# Patient Record
Sex: Female | Born: 1945 | Race: White | Hispanic: No | Smoking: Former smoker
Health system: Southern US, Community
[De-identification: ages and names within clinical notes are randomized; demographics above are authoritative.]

## PROBLEM LIST (undated history)

## (undated) DIAGNOSIS — Z87442 Personal history of urinary calculi: Secondary | ICD-10-CM

## (undated) DIAGNOSIS — G5793 Unspecified mononeuropathy of bilateral lower limbs: Secondary | ICD-10-CM

## (undated) DIAGNOSIS — I341 Nonrheumatic mitral (valve) prolapse: Secondary | ICD-10-CM

## (undated) DIAGNOSIS — M5430 Sciatica, unspecified side: Secondary | ICD-10-CM

## (undated) DIAGNOSIS — I10 Essential (primary) hypertension: Secondary | ICD-10-CM

## (undated) DIAGNOSIS — Z9889 Other specified postprocedural states: Secondary | ICD-10-CM

## (undated) DIAGNOSIS — F32A Depression, unspecified: Secondary | ICD-10-CM

## (undated) DIAGNOSIS — E785 Hyperlipidemia, unspecified: Secondary | ICD-10-CM

## (undated) DIAGNOSIS — Z974 Presence of external hearing-aid: Secondary | ICD-10-CM

## (undated) DIAGNOSIS — G2581 Restless legs syndrome: Secondary | ICD-10-CM

## (undated) DIAGNOSIS — J45909 Unspecified asthma, uncomplicated: Secondary | ICD-10-CM

## (undated) DIAGNOSIS — I509 Heart failure, unspecified: Secondary | ICD-10-CM

## (undated) DIAGNOSIS — E119 Type 2 diabetes mellitus without complications: Secondary | ICD-10-CM

## (undated) DIAGNOSIS — F329 Major depressive disorder, single episode, unspecified: Secondary | ICD-10-CM

## (undated) DIAGNOSIS — M539 Dorsopathy, unspecified: Secondary | ICD-10-CM

## (undated) DIAGNOSIS — E039 Hypothyroidism, unspecified: Secondary | ICD-10-CM

## (undated) DIAGNOSIS — R42 Dizziness and giddiness: Secondary | ICD-10-CM

## (undated) DIAGNOSIS — Z972 Presence of dental prosthetic device (complete) (partial): Secondary | ICD-10-CM

## (undated) DIAGNOSIS — K649 Unspecified hemorrhoids: Secondary | ICD-10-CM

## (undated) DIAGNOSIS — R112 Nausea with vomiting, unspecified: Secondary | ICD-10-CM

## (undated) HISTORY — PX: APPENDECTOMY: SHX54

## (undated) HISTORY — PX: EYE SURGERY: SHX253

## (undated) HISTORY — PX: PAROTIDECTOMY: SUR1003

## (undated) HISTORY — PX: TONSILLECTOMY: SUR1361

---
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
## 2003-04-12 ENCOUNTER — Other Ambulatory Visit: Payer: Self-pay

## 2003-12-06 ENCOUNTER — Ambulatory Visit: Payer: Self-pay | Admitting: Family Medicine

## 2005-04-05 ENCOUNTER — Ambulatory Visit: Payer: Self-pay | Admitting: Gastroenterology

## 2005-06-06 ENCOUNTER — Other Ambulatory Visit: Payer: Self-pay

## 2005-06-13 ENCOUNTER — Ambulatory Visit: Payer: Self-pay | Admitting: Gastroenterology

## 2014-01-04 ENCOUNTER — Emergency Department: Payer: Self-pay | Admitting: Emergency Medicine

## 2014-01-04 LAB — CBC
HCT: 35 % (ref 35.0–47.0)
HGB: 11.8 g/dL — ABNORMAL LOW (ref 12.0–16.0)
MCH: 31.5 pg (ref 26.0–34.0)
MCHC: 33.8 g/dL (ref 32.0–36.0)
MCV: 93 fL (ref 80–100)
Platelet: 238 10*3/uL (ref 150–440)
RBC: 3.75 10*6/uL — ABNORMAL LOW (ref 3.80–5.20)
RDW: 13.1 % (ref 11.5–14.5)
WBC: 10.5 10*3/uL (ref 3.6–11.0)

## 2014-01-04 LAB — COMPREHENSIVE METABOLIC PANEL
ALK PHOS: 52 U/L
AST: 38 U/L — AB (ref 15–37)
Albumin: 3.6 g/dL (ref 3.4–5.0)
Anion Gap: 10 (ref 7–16)
BUN: 13 mg/dL (ref 7–18)
Bilirubin,Total: 0.4 mg/dL (ref 0.2–1.0)
Calcium, Total: 8.7 mg/dL (ref 8.5–10.1)
Chloride: 98 mmol/L (ref 98–107)
Co2: 26 mmol/L (ref 21–32)
Creatinine: 0.7 mg/dL (ref 0.60–1.30)
EGFR (African American): >60
Glucose: 115 mg/dL — ABNORMAL HIGH (ref 65–99)
Osmolality: 269 (ref 275–301)
Potassium: 4 mmol/L (ref 3.5–5.1)
SGPT (ALT): 36 U/L
Sodium: 134 mmol/L — ABNORMAL LOW (ref 136–145)
Total Protein: 7.8 g/dL (ref 6.4–8.2)

## 2019-08-31 ENCOUNTER — Other Ambulatory Visit: Payer: Self-pay

## 2019-08-31 ENCOUNTER — Encounter: Payer: Self-pay | Admitting: Ophthalmology

## 2019-09-02 ENCOUNTER — Other Ambulatory Visit: Payer: Self-pay

## 2019-09-02 ENCOUNTER — Other Ambulatory Visit
Admission: RE | Admit: 2019-09-02 | Discharge: 2019-09-02 | Disposition: A | Discharge status: Home / Self Care | Payer: Medicare HMO | Source: Ambulatory Visit | Attending: Ophthalmology | Admitting: Ophthalmology

## 2019-09-02 DIAGNOSIS — Z20822 Contact with and (suspected) exposure to covid-19: Secondary | ICD-10-CM | POA: Diagnosis not present

## 2019-09-02 DIAGNOSIS — Z01812 Encounter for preprocedural laboratory examination: Secondary | ICD-10-CM | POA: Insufficient documentation

## 2019-09-02 LAB — SARS CORONAVIRUS 2 (TAT 6-24 HRS): SARS Coronavirus 2: NEGATIVE

## 2019-09-02 NOTE — Discharge Instructions (Signed)

## 2019-09-02 NOTE — Anesthesia Preprocedure Evaluation (Addendum)
Anesthesia Evaluation  Patient identified by MRN, date of birth, ID band Patient awake    Reviewed: Allergy & Precautions, NPO status , Patient's Chart, lab work & pertinent test results  History of Anesthesia Complications (+) PONV and history of anesthetic complications  Airway Mallampati: IV   Neck ROM: Full    Dental   Upper bridge and caps, lower partial:   Pulmonary asthma , former smoker (quit 1999),    Pulmonary exam normal breath sounds clear to auscultation       Cardiovascular hypertension, Normal cardiovascular exam Rhythm:Regular Rate:Normal  MVP   Neuro/Psych PSYCHIATRIC DISORDERS Depression HOH, vertigo    GI/Hepatic negative GI ROS,   Endo/Other  diabetes, Type 2Hypothyroidism Morbid obesity  Renal/GU Renal disease (nephrolithiasis)     Musculoskeletal   Abdominal   Peds  Hematology negative hematology ROS (+)   Anesthesia Other Findings PCP note 07/09/19:   Assessment/Plan:   Diagnoses and all orders for this visit:  Type 2 diabetes mellitus with hyperglycemia, without long-term current use of insulin (CMS-HCC): Chronic, very close to goal. Discussed that we could try increasing Metformin to 1000 mg twice daily, though she would like to defer for now. Continue current dose of Metformin and Actos. - [place] POC Hemoglobin A1C; Future - [facility] POC Hemoglobin A1C  Essential hypertension: Chronic, controlled. Continue current medications.  Hypothyroidism, acquired, unspecified: Chronic, controlled based on last check. Continue levothyroxine.  Hyperlipidemia, mixed: Chronic. Statin was changed after last check; we will recheck today to see if Crestor has improved her cholesterol. - Lipid Panel W/Reflex Direct Low Density Lipoprotein (LDL) Cholesterol  Other orders: Ordered per request. - naproxen (EC NAPROSYN) 500 MG EC tablet; Take 1 tablet (500 mg total) by mouth 2 (two) times daily with  meals  Follow-up in December for AWV and chronic care, sooner if needed.   Reproductive/Obstetrics                            Anesthesia Physical Anesthesia Plan  ASA: III  Anesthesia Plan: MAC   Post-op Pain Management:    Induction: Intravenous  PONV Risk Score and Plan: 3 and TIVA, Midazolam and Treatment may vary due to age or medical condition  Airway Management Planned: Nasal Cannula  Additional Equipment:   Intra-op Plan:   Post-operative Plan:   Informed Consent: I have reviewed the patients History and Physical, chart, labs and discussed the procedure including the risks, benefits and alternatives for the proposed anesthesia with the patient or authorized representative who has indicated his/her understanding and acceptance.       Plan Discussed with: CRNA  Anesthesia Plan Comments:        Anesthesia Quick Evaluation

## 2019-09-06 ENCOUNTER — Encounter: Payer: Self-pay | Admitting: Ophthalmology

## 2019-09-06 ENCOUNTER — Ambulatory Visit: Payer: Medicare HMO | Admitting: Anesthesiology

## 2019-09-06 ENCOUNTER — Encounter
Admission: RE | Disposition: A | Discharge status: Home / Self Care | Payer: Self-pay | Source: Home / Self Care | Attending: Ophthalmology

## 2019-09-06 ENCOUNTER — Other Ambulatory Visit: Payer: Self-pay

## 2019-09-06 ENCOUNTER — Ambulatory Visit
Admission: RE | Admit: 2019-09-06 | Discharge: 2019-09-06 | Disposition: A | Discharge status: Home / Self Care | Payer: Medicare HMO | Attending: Ophthalmology | Admitting: Ophthalmology

## 2019-09-06 DIAGNOSIS — E782 Mixed hyperlipidemia: Secondary | ICD-10-CM | POA: Insufficient documentation

## 2019-09-06 DIAGNOSIS — Z87891 Personal history of nicotine dependence: Secondary | ICD-10-CM | POA: Diagnosis not present

## 2019-09-06 DIAGNOSIS — E1136 Type 2 diabetes mellitus with diabetic cataract: Secondary | ICD-10-CM | POA: Diagnosis not present

## 2019-09-06 DIAGNOSIS — Z882 Allergy status to sulfonamides status: Secondary | ICD-10-CM | POA: Diagnosis not present

## 2019-09-06 DIAGNOSIS — Z881 Allergy status to other antibiotic agents status: Secondary | ICD-10-CM | POA: Insufficient documentation

## 2019-09-06 DIAGNOSIS — Z888 Allergy status to other drugs, medicaments and biological substances status: Secondary | ICD-10-CM | POA: Insufficient documentation

## 2019-09-06 DIAGNOSIS — H2512 Age-related nuclear cataract, left eye: Secondary | ICD-10-CM | POA: Diagnosis not present

## 2019-09-06 DIAGNOSIS — H9190 Unspecified hearing loss, unspecified ear: Secondary | ICD-10-CM | POA: Diagnosis not present

## 2019-09-06 DIAGNOSIS — Z6841 Body Mass Index (BMI) 40.0 and over, adult: Secondary | ICD-10-CM | POA: Insufficient documentation

## 2019-09-06 DIAGNOSIS — E1140 Type 2 diabetes mellitus with diabetic neuropathy, unspecified: Secondary | ICD-10-CM | POA: Diagnosis not present

## 2019-09-06 DIAGNOSIS — M1990 Unspecified osteoarthritis, unspecified site: Secondary | ICD-10-CM | POA: Diagnosis not present

## 2019-09-06 DIAGNOSIS — Z7982 Long term (current) use of aspirin: Secondary | ICD-10-CM | POA: Diagnosis not present

## 2019-09-06 DIAGNOSIS — Z791 Long term (current) use of non-steroidal anti-inflammatories (NSAID): Secondary | ICD-10-CM | POA: Diagnosis not present

## 2019-09-06 DIAGNOSIS — E1165 Type 2 diabetes mellitus with hyperglycemia: Secondary | ICD-10-CM | POA: Diagnosis not present

## 2019-09-06 DIAGNOSIS — E039 Hypothyroidism, unspecified: Secondary | ICD-10-CM | POA: Insufficient documentation

## 2019-09-06 DIAGNOSIS — F419 Anxiety disorder, unspecified: Secondary | ICD-10-CM | POA: Diagnosis not present

## 2019-09-06 DIAGNOSIS — Z880 Allergy status to penicillin: Secondary | ICD-10-CM | POA: Diagnosis not present

## 2019-09-06 DIAGNOSIS — G8929 Other chronic pain: Secondary | ICD-10-CM | POA: Diagnosis not present

## 2019-09-06 DIAGNOSIS — Z79899 Other long term (current) drug therapy: Secondary | ICD-10-CM | POA: Insufficient documentation

## 2019-09-06 DIAGNOSIS — I341 Nonrheumatic mitral (valve) prolapse: Secondary | ICD-10-CM | POA: Insufficient documentation

## 2019-09-06 DIAGNOSIS — I10 Essential (primary) hypertension: Secondary | ICD-10-CM | POA: Insufficient documentation

## 2019-09-06 DIAGNOSIS — Z79890 Hormone replacement therapy (postmenopausal): Secondary | ICD-10-CM | POA: Insufficient documentation

## 2019-09-06 DIAGNOSIS — K219 Gastro-esophageal reflux disease without esophagitis: Secondary | ICD-10-CM | POA: Diagnosis not present

## 2019-09-06 DIAGNOSIS — Z7984 Long term (current) use of oral hypoglycemic drugs: Secondary | ICD-10-CM | POA: Insufficient documentation

## 2019-09-06 DIAGNOSIS — K589 Irritable bowel syndrome without diarrhea: Secondary | ICD-10-CM | POA: Insufficient documentation

## 2019-09-06 DIAGNOSIS — J42 Unspecified chronic bronchitis: Secondary | ICD-10-CM | POA: Insufficient documentation

## 2019-09-06 HISTORY — DX: Sciatica, unspecified side: M54.30

## 2019-09-06 HISTORY — DX: Nausea with vomiting, unspecified: R11.2

## 2019-09-06 HISTORY — DX: Restless legs syndrome: G25.81

## 2019-09-06 HISTORY — DX: Presence of dental prosthetic device (complete) (partial): Z97.2

## 2019-09-06 HISTORY — DX: Hypothyroidism, unspecified: E03.9

## 2019-09-06 HISTORY — DX: Other specified postprocedural states: Z98.890

## 2019-09-06 HISTORY — DX: Nonrheumatic mitral (valve) prolapse: I34.1

## 2019-09-06 HISTORY — DX: Depression, unspecified: F32.A

## 2019-09-06 HISTORY — DX: Type 2 diabetes mellitus without complications: E11.9

## 2019-09-06 HISTORY — DX: Unspecified mononeuropathy of bilateral lower limbs: G57.93

## 2019-09-06 HISTORY — DX: Essential (primary) hypertension: I10

## 2019-09-06 HISTORY — DX: Hyperlipidemia, unspecified: E78.5

## 2019-09-06 HISTORY — DX: Dizziness and giddiness: R42

## 2019-09-06 HISTORY — DX: Unspecified asthma, uncomplicated: J45.909

## 2019-09-06 HISTORY — DX: Dorsopathy, unspecified: M53.9

## 2019-09-06 HISTORY — DX: Personal history of urinary calculi: Z87.442

## 2019-09-06 HISTORY — PX: CATARACT EXTRACTION W/PHACO: SHX586

## 2019-09-06 HISTORY — DX: Presence of external hearing-aid: Z97.4

## 2019-09-06 LAB — GLUCOSE, CAPILLARY
Glucose-Capillary: 122 mg/dL — ABNORMAL HIGH (ref 70–99)
Glucose-Capillary: 109 mg/dL — ABNORMAL HIGH (ref 70–99)

## 2019-09-06 SURGERY — PHACOEMULSIFICATION, CATARACT, WITH IOL INSERTION
Anesthesia: Monitor Anesthesia Care | Site: Eye | Laterality: Left

## 2019-09-06 MED ORDER — SODIUM HYALURONATE 10 MG/ML IO SOLN
INTRAOCULAR | Status: DC | PRN
  Administered 2019-09-06: 0.55 mL via INTRAOCULAR

## 2019-09-06 MED ORDER — EPINEPHRINE PF 1 MG/ML IJ SOLN
INTRAOCULAR | Status: DC | PRN
  Administered 2019-09-06: 67 mL via OPHTHALMIC

## 2019-09-06 MED ORDER — ONDANSETRON HCL 4 MG/2ML IJ SOLN: 4.0000 mg | Freq: Once | INTRAMUSCULAR | Status: DC | PRN

## 2019-09-06 MED ORDER — MIDAZOLAM HCL 2 MG/2ML IJ SOLN
INTRAMUSCULAR | Status: DC | PRN
  Administered 2019-09-06 (×2): 1 mg via INTRAVENOUS

## 2019-09-06 MED ORDER — ARMC OPHTHALMIC DILATING DROPS
1.0000 "application " | OPHTHALMIC | Status: DC | PRN
  Administered 2019-09-06 (×3): 1 via OPHTHALMIC

## 2019-09-06 MED ORDER — TETRACAINE HCL 0.5 % OP SOLN
1.0000 [drp] | OPHTHALMIC | Status: DC | PRN
  Administered 2019-09-06 (×3): 1 [drp] via OPHTHALMIC

## 2019-09-06 MED ORDER — ACETAMINOPHEN 160 MG/5ML PO SOLN: 325.0000 mg | ORAL | Status: DC | PRN

## 2019-09-06 MED ORDER — FENTANYL CITRATE (PF) 100 MCG/2ML IJ SOLN
INTRAMUSCULAR | Status: DC | PRN
  Administered 2019-09-06: 50 ug via INTRAVENOUS

## 2019-09-06 MED ORDER — POLYMYXIN B-TRIMETHOPRIM 10000-0.1 UNIT/ML-% OP SOLN
OPHTHALMIC | Status: DC | PRN
  Administered 2019-09-06: 2 [drp] via OPHTHALMIC

## 2019-09-06 MED ORDER — SODIUM HYALURONATE 23 MG/ML IO SOLN
INTRAOCULAR | Status: DC | PRN
  Administered 2019-09-06: 0.6 mL via INTRAOCULAR

## 2019-09-06 MED ORDER — MOXIFLOXACIN HCL 0.5 % OP SOLN: OPHTHALMIC | Status: DC | PRN

## 2019-09-06 MED ORDER — LIDOCAINE HCL (PF) 2 % IJ SOLN
INTRAOCULAR | Status: DC | PRN
  Administered 2019-09-06: 1 mL via INTRAOCULAR

## 2019-09-06 MED ORDER — LACTATED RINGERS IV SOLN: INTRAVENOUS | Status: DC

## 2019-09-06 MED ORDER — ACETAMINOPHEN 325 MG PO TABS: 650.0000 mg | ORAL_TABLET | Freq: Once | ORAL | Status: DC | PRN

## 2019-09-06 SURGICAL SUPPLY — 18 items
CANNULA ANT/CHMB 27GA (MISCELLANEOUS) ×4 IMPLANT
DISSECTOR HYDRO NUCLEUS 50X22 (MISCELLANEOUS) ×2 IMPLANT
GLOVE SURG LX 7.5 STRW (GLOVE) ×2
GLOVE SURG LX STRL 7.5 STRW (GLOVE) ×2 IMPLANT
GLOVE SURG SYN 8.5  E (GLOVE) ×1
GLOVE SURG SYN 8.5 E (GLOVE) ×1 IMPLANT
GOWN STRL REUS W/ TWL LRG LVL3 (GOWN DISPOSABLE) ×2 IMPLANT
GOWN STRL REUS W/TWL LRG LVL3 (GOWN DISPOSABLE) ×4
LENS IOL DIOP 19.5 (Intraocular Lens) ×2 IMPLANT
LENS IOL TECNIS MONO 19.5 (Intraocular Lens) ×1 IMPLANT
MARKER SKIN DUAL TIP RULER LAB (MISCELLANEOUS) ×2 IMPLANT
PACK DR. KING ARMS (PACKS) ×2 IMPLANT
PACK EYE AFTER SURG (MISCELLANEOUS) ×2 IMPLANT
PACK OPTHALMIC (MISCELLANEOUS) ×2 IMPLANT
SYR 3ML LL SCALE MARK (SYRINGE) ×2 IMPLANT
SYR TB 1ML LUER SLIP (SYRINGE) ×2 IMPLANT
WATER STERILE IRR 250ML POUR (IV SOLUTION) ×2 IMPLANT
WIPE NON LINTING 3.25X3.25 (MISCELLANEOUS) ×2 IMPLANT

## 2019-09-06 NOTE — Transfer of Care (Signed)
Immediate Anesthesia Transfer of Care Note  Patient: Tina Foley  Procedure(s) Performed: CATARACT EXTRACTION PHACO AND INTRAOCULAR LENS PLACEMENT (IOC) LEFT DIABETIC (Left Eye)  Patient Location: PACU  Anesthesia Type: MAC  Level of Consciousness: awake, alert  and patient cooperative  Airway and Oxygen Therapy: Patient Spontanous Breathing and Patient connected to supplemental oxygen  Post-op Assessment: Post-op Vital signs reviewed, Patient's Cardiovascular Status Stable, Respiratory Function Stable, Patent Airway and No signs of Nausea or vomiting  Post-op Vital Signs: Reviewed and stable  Complications: No complications documented.

## 2019-09-06 NOTE — H&P (Signed)

## 2019-09-06 NOTE — Anesthesia Procedure Notes (Signed)
Procedure Name: MAC Date/Time: 09/06/2019 8:26 AM Performed by: Silvana Newness, CRNA Pre-anesthesia Checklist: Patient identified, Emergency Drugs available, Suction available, Patient being monitored and Timeout performed Patient Re-evaluated:Patient Re-evaluated prior to induction Oxygen Delivery Method: Nasal cannula Induction Type: IV induction Placement Confirmation: positive ETCO2

## 2019-09-06 NOTE — Anesthesia Postprocedure Evaluation (Signed)
Anesthesia Post Note  Patient: Tina Foley  Procedure(s) Performed: CATARACT EXTRACTION PHACO AND INTRAOCULAR LENS PLACEMENT (IOC) LEFT DIABETIC (Left Eye)     Patient location during evaluation: PACU Anesthesia Type: MAC Level of consciousness: awake and alert, oriented and patient cooperative Pain management: pain level controlled Vital Signs Assessment: post-procedure vital signs reviewed and stable Respiratory status: spontaneous breathing, nonlabored ventilation and respiratory function stable Cardiovascular status: blood pressure returned to baseline and stable Postop Assessment: adequate PO intake Anesthetic complications: no   No complications documented.  Darrin Nipper

## 2019-09-06 NOTE — Op Note (Signed)
OPERATIVE NOTE  LEONETTA MCGIVERN [id number] 09/06/2019   PREOPERATIVE DIAGNOSIS:  Nuclear sclerotic cataract left eye.  H25.12   POSTOPERATIVE DIAGNOSIS:    Nuclear sclerotic cataract left eye.     PROCEDURE:  Phacoemusification with posterior chamber intraocular lens placement of the left eye   LENS:   Implant Name Type Inv. Item Serial No. Manufacturer Lot No. LRB No. Used Action  LENS IOL DIOP 19.5 - [id number] Intraocular Lens LENS IOL DIOP 19.5 [id number] [employer]  Left 1 Implanted      Procedure(s) with comments: CATARACT EXTRACTION PHACO AND INTRAOCULAR LENS PLACEMENT (IOC) LEFT DIABETIC (Left) - 2.28 0:26.2  DCB00 +19.5   ULTRASOUND TIME: 0 minutes 26 seconds.  CDE 2.28   SURGEON:  Benay Pillow, MD, MPH   ANESTHESIA:  Topical with tetracaine drops augmented with 1% preservative-free intracameral lidocaine.  ESTIMATED BLOOD LOSS: <1 mL   COMPLICATIONS:  None.   DESCRIPTION OF PROCEDURE:  The patient was identified in the holding room and transported to the operating room and placed in the supine position under the operating microscope.  The left eye was identified as the operative eye and it was prepped and draped in the usual sterile ophthalmic fashion.   A 1.0 millimeter clear-corneal paracentesis was made at the 5:00 position. 0.5 ml of preservative-free 1% lidocaine with epinephrine was injected into the anterior chamber.  The anterior chamber was filled with Healon 5 viscoelastic.  A 2.4 millimeter keratome was used to make a near-clear corneal incision at the 2:00 position.  A curvilinear capsulorrhexis was made with a cystotome and capsulorrhexis forceps.  Balanced salt solution was used to hydrodissect and hydrodelineate the nucleus.   Phacoemulsification was then used in stop and chop fashion to remove the lens nucleus and epinucleus.  The remaining cortex was then removed using the irrigation and aspiration handpiece. Healon was then placed into  the capsular bag to distend it for lens placement.  A lens was then injected into the capsular bag.  The remaining viscoelastic was aspirated.   Wounds were hydrated with balanced salt solution.  The anterior chamber was inflated to a physiologic pressure with balanced salt solution.  The patient reported an allergy to cipro, so intracameral antibiotics were not used.   Polytrim drops were placed on the eye.    No wound leaks were noted.  The patient was taken to the recovery room in stable condition without complications of anesthesia or surgery  Benay Pillow 09/06/2019, 8:47 AM

## 2019-09-07 ENCOUNTER — Encounter: Payer: Self-pay | Admitting: Ophthalmology

## 2019-09-20 ENCOUNTER — Encounter: Payer: Self-pay | Admitting: Ophthalmology

## 2019-09-20 ENCOUNTER — Other Ambulatory Visit: Payer: Self-pay

## 2019-09-23 ENCOUNTER — Other Ambulatory Visit: Payer: Self-pay

## 2019-09-23 ENCOUNTER — Other Ambulatory Visit
Admission: RE | Admit: 2019-09-23 | Discharge: 2019-09-23 | Disposition: A | Discharge status: Home / Self Care | Payer: Medicare HMO | Source: Ambulatory Visit | Attending: Ophthalmology | Admitting: Ophthalmology

## 2019-09-23 DIAGNOSIS — Z20822 Contact with and (suspected) exposure to covid-19: Secondary | ICD-10-CM | POA: Insufficient documentation

## 2019-09-23 DIAGNOSIS — Z01812 Encounter for preprocedural laboratory examination: Secondary | ICD-10-CM | POA: Diagnosis present

## 2019-09-23 NOTE — Discharge Instructions (Signed)

## 2019-09-24 LAB — SARS CORONAVIRUS 2 (TAT 6-24 HRS): SARS Coronavirus 2: NEGATIVE

## 2019-09-27 ENCOUNTER — Ambulatory Visit: Payer: Medicare HMO | Admitting: Anesthesiology

## 2019-09-27 ENCOUNTER — Encounter: Payer: Self-pay | Admitting: Ophthalmology

## 2019-09-27 ENCOUNTER — Ambulatory Visit
Admission: RE | Admit: 2019-09-27 | Discharge: 2019-09-27 | Disposition: A | Discharge status: Home / Self Care | Payer: Medicare HMO | Attending: Ophthalmology | Admitting: Ophthalmology

## 2019-09-27 ENCOUNTER — Other Ambulatory Visit: Payer: Self-pay

## 2019-09-27 ENCOUNTER — Encounter
Admission: RE | Disposition: A | Discharge status: Home / Self Care | Payer: Self-pay | Source: Home / Self Care | Attending: Ophthalmology

## 2019-09-27 DIAGNOSIS — H2511 Age-related nuclear cataract, right eye: Secondary | ICD-10-CM | POA: Diagnosis not present

## 2019-09-27 DIAGNOSIS — H9190 Unspecified hearing loss, unspecified ear: Secondary | ICD-10-CM | POA: Diagnosis not present

## 2019-09-27 DIAGNOSIS — E1140 Type 2 diabetes mellitus with diabetic neuropathy, unspecified: Secondary | ICD-10-CM | POA: Insufficient documentation

## 2019-09-27 DIAGNOSIS — K219 Gastro-esophageal reflux disease without esophagitis: Secondary | ICD-10-CM | POA: Insufficient documentation

## 2019-09-27 DIAGNOSIS — Z7982 Long term (current) use of aspirin: Secondary | ICD-10-CM | POA: Diagnosis not present

## 2019-09-27 DIAGNOSIS — F419 Anxiety disorder, unspecified: Secondary | ICD-10-CM | POA: Diagnosis not present

## 2019-09-27 DIAGNOSIS — E1136 Type 2 diabetes mellitus with diabetic cataract: Secondary | ICD-10-CM | POA: Diagnosis not present

## 2019-09-27 DIAGNOSIS — Z87891 Personal history of nicotine dependence: Secondary | ICD-10-CM | POA: Diagnosis not present

## 2019-09-27 DIAGNOSIS — Z6841 Body Mass Index (BMI) 40.0 and over, adult: Secondary | ICD-10-CM | POA: Diagnosis not present

## 2019-09-27 DIAGNOSIS — Z79899 Other long term (current) drug therapy: Secondary | ICD-10-CM | POA: Insufficient documentation

## 2019-09-27 DIAGNOSIS — E782 Mixed hyperlipidemia: Secondary | ICD-10-CM | POA: Insufficient documentation

## 2019-09-27 DIAGNOSIS — Z791 Long term (current) use of non-steroidal anti-inflammatories (NSAID): Secondary | ICD-10-CM | POA: Diagnosis not present

## 2019-09-27 DIAGNOSIS — I10 Essential (primary) hypertension: Secondary | ICD-10-CM | POA: Insufficient documentation

## 2019-09-27 DIAGNOSIS — Z7984 Long term (current) use of oral hypoglycemic drugs: Secondary | ICD-10-CM | POA: Insufficient documentation

## 2019-09-27 DIAGNOSIS — F329 Major depressive disorder, single episode, unspecified: Secondary | ICD-10-CM | POA: Insufficient documentation

## 2019-09-27 DIAGNOSIS — Z79890 Hormone replacement therapy (postmenopausal): Secondary | ICD-10-CM | POA: Diagnosis not present

## 2019-09-27 DIAGNOSIS — E7800 Pure hypercholesterolemia, unspecified: Secondary | ICD-10-CM | POA: Diagnosis not present

## 2019-09-27 DIAGNOSIS — E039 Hypothyroidism, unspecified: Secondary | ICD-10-CM | POA: Insufficient documentation

## 2019-09-27 HISTORY — PX: CATARACT EXTRACTION W/PHACO: SHX586

## 2019-09-27 LAB — GLUCOSE, CAPILLARY
Glucose-Capillary: 83 mg/dL (ref 70–99)
Glucose-Capillary: 87 mg/dL (ref 70–99)

## 2019-09-27 SURGERY — PHACOEMULSIFICATION, CATARACT, WITH IOL INSERTION
Anesthesia: Monitor Anesthesia Care | Site: Eye | Laterality: Right

## 2019-09-27 MED ORDER — FENTANYL CITRATE (PF) 100 MCG/2ML IJ SOLN
INTRAMUSCULAR | Status: DC | PRN
  Administered 2019-09-27: 50 ug via INTRAVENOUS

## 2019-09-27 MED ORDER — SODIUM HYALURONATE 10 MG/ML IO SOLN
INTRAOCULAR | Status: DC | PRN
  Administered 2019-09-27: 0.55 mL via INTRAOCULAR

## 2019-09-27 MED ORDER — MIDAZOLAM HCL 2 MG/2ML IJ SOLN
INTRAMUSCULAR | Status: DC | PRN
  Administered 2019-09-27 (×2): 1 mg via INTRAVENOUS

## 2019-09-27 MED ORDER — SODIUM HYALURONATE 23 MG/ML IO SOLN
INTRAOCULAR | Status: DC | PRN
  Administered 2019-09-27: 0.6 mL via INTRAOCULAR

## 2019-09-27 MED ORDER — LIDOCAINE HCL (PF) 2 % IJ SOLN
INTRAOCULAR | Status: DC | PRN
  Administered 2019-09-27: 1 mL via INTRAOCULAR

## 2019-09-27 MED ORDER — TETRACAINE HCL 0.5 % OP SOLN
1.0000 [drp] | OPHTHALMIC | Status: DC | PRN
  Administered 2019-09-27 (×3): 1 [drp] via OPHTHALMIC

## 2019-09-27 MED ORDER — EPINEPHRINE PF 1 MG/ML IJ SOLN
INTRAOCULAR | Status: DC | PRN
  Administered 2019-09-27: 74 mL via OPHTHALMIC

## 2019-09-27 MED ORDER — ACETAMINOPHEN 325 MG PO TABS: 325.0000 mg | ORAL_TABLET | Freq: Once | ORAL | Status: DC

## 2019-09-27 MED ORDER — ARMC OPHTHALMIC DILATING DROPS
1.0000 "application " | OPHTHALMIC | Status: DC | PRN
  Administered 2019-09-27 (×3): 1 via OPHTHALMIC

## 2019-09-27 MED ORDER — POLYMYXIN B-TRIMETHOPRIM 10000-0.1 UNIT/ML-% OP SOLN
OPHTHALMIC | Status: DC | PRN
  Administered 2019-09-27: 1 [drp] via OPHTHALMIC

## 2019-09-27 MED ORDER — LACTATED RINGERS IV SOLN: INTRAVENOUS | Status: DC

## 2019-09-27 MED ORDER — ACETAMINOPHEN 160 MG/5ML PO SOLN: 325.0000 mg | Freq: Once | ORAL | Status: DC

## 2019-09-27 SURGICAL SUPPLY — 20 items
CANNULA ANT/CHMB 27G (MISCELLANEOUS) ×2 IMPLANT
CANNULA ANT/CHMB 27GA (MISCELLANEOUS) ×6 IMPLANT
DISSECTOR HYDRO NUCLEUS 50X22 (MISCELLANEOUS) ×3 IMPLANT
GLOVE SURG LX 7.5 STRW (GLOVE) ×2
GLOVE SURG LX STRL 7.5 STRW (GLOVE) ×1 IMPLANT
GLOVE SURG SYN 8.5  E (GLOVE) ×2
GLOVE SURG SYN 8.5 E (GLOVE) ×1 IMPLANT
GLOVE SURG SYN 8.5 PF PI (GLOVE) ×1 IMPLANT
GOWN STRL REUS W/ TWL LRG LVL3 (GOWN DISPOSABLE) ×2 IMPLANT
GOWN STRL REUS W/TWL LRG LVL3 (GOWN DISPOSABLE) ×6
LENS IOL DIOP 20.5 (Intraocular Lens) ×3 IMPLANT
LENS IOL TECNIS MONO 20.5 (Intraocular Lens) IMPLANT
MARKER SKIN DUAL TIP RULER LAB (MISCELLANEOUS) ×3 IMPLANT
PACK DR. KING ARMS (PACKS) ×3 IMPLANT
PACK EYE AFTER SURG (MISCELLANEOUS) ×3 IMPLANT
PACK OPTHALMIC (MISCELLANEOUS) ×3 IMPLANT
SYR 3ML LL SCALE MARK (SYRINGE) ×3 IMPLANT
SYR TB 1ML LUER SLIP (SYRINGE) ×3 IMPLANT
WATER STERILE IRR 250ML POUR (IV SOLUTION) ×3 IMPLANT
WIPE NON LINTING 3.25X3.25 (MISCELLANEOUS) ×3 IMPLANT

## 2019-09-27 NOTE — Anesthesia Postprocedure Evaluation (Signed)
Anesthesia Post Note  Patient: Tina Foley  Procedure(s) Performed: CATARACT EXTRACTION PHACO AND INTRAOCULAR LENS PLACEMENT (IOC) RIGHT DIABETIC 1.80  00:25.5 (Right Eye)     Patient location during evaluation: PACU Anesthesia Type: MAC Level of consciousness: awake and alert and oriented Pain management: satisfactory to patient Vital Signs Assessment: post-procedure vital signs reviewed and stable Respiratory status: spontaneous breathing, nonlabored ventilation and respiratory function stable Cardiovascular status: blood pressure returned to baseline and stable Postop Assessment: Adequate PO intake and No signs of nausea or vomiting Anesthetic complications: no   No complications documented.  Raliegh Ip

## 2019-09-27 NOTE — Op Note (Signed)
OPERATIVE NOTE  Tina Foley [id number] 09/27/2019   PREOPERATIVE DIAGNOSIS:  Nuclear sclerotic cataract right eye.  H25.11   POSTOPERATIVE DIAGNOSIS:    Nuclear sclerotic cataract right eye.     PROCEDURE:  Phacoemusification with posterior chamber intraocular lens placement of the right eye   LENS:   Implant Name Type Inv. Item Serial No. Manufacturer Lot No. LRB No. Used Action  LENS IOL DIOP 20.5 - [id number] Intraocular Lens LENS IOL DIOP 20.5 [id number] [employer]  Right 1 Implanted       Procedure(s) with comments: CATARACT EXTRACTION PHACO AND INTRAOCULAR LENS PLACEMENT (IOC) RIGHT DIABETIC 1.80  00:25.5 (Right) - Diabetic - oral meds  DCB00 +20.5   ULTRASOUND TIME: 0 minutes 25 seconds.  CDE 1.80   SURGEON:  Benay Pillow, MD, MPH  ANESTHESIOLOGIST: Anesthesiologist: Ronelle Nigh, MD CRNA: Cameron Ali, CRNA   ANESTHESIA:  Topical with tetracaine drops augmented with 1% preservative-free intracameral lidocaine.  ESTIMATED BLOOD LOSS: less than 1 mL.   COMPLICATIONS:  None.   DESCRIPTION OF PROCEDURE:  The patient was identified in the holding room and transported to the operating room and placed in the supine position under the operating microscope.  The right eye was identified as the operative eye and it was prepped and draped in the usual sterile ophthalmic fashion.   A 1.0 millimeter clear-corneal paracentesis was made at the 10:30 position. 0.5 ml of preservative-free 1% lidocaine with epinephrine was injected into the anterior chamber.  The anterior chamber was filled with Healon 5 viscoelastic.  A 2.4 millimeter keratome was used to make a near-clear corneal incision at the 8:00 position.  A curvilinear capsulorrhexis was made with a cystotome and capsulorrhexis forceps.  Balanced salt solution was used to hydrodissect and hydrodelineate the nucleus.   Phacoemulsification was then used in stop and chop fashion to remove the lens nucleus and  epinucleus.  The remaining cortex was then removed using the irrigation and aspiration handpiece. Healon was then placed into the capsular bag to distend it for lens placement.  A lens was then injected into the capsular bag.  The remaining viscoelastic was aspirated.   Wounds were hydrated with balanced salt solution.  The anterior chamber was inflated to a physiologic pressure with balanced salt solution.   No intracameral antibiotics were used due to patient allergies.  Polytrim drops were placed on the ocular surface.   No wound leaks were noted.  The patient was taken to the recovery room in stable condition without complications of anesthesia or surgery  Benay Pillow 09/27/2019, 9:39 AM

## 2019-09-27 NOTE — Anesthesia Preprocedure Evaluation (Signed)
Anesthesia Evaluation  Patient identified by MRN, date of birth, ID band Patient awake    Reviewed: Allergy & Precautions, H&P , NPO status , Patient's Chart, lab work & pertinent test results  History of Anesthesia Complications (+) PONV and history of anesthetic complications  Airway Mallampati: II  TM Distance: >3 FB Neck ROM: full    Dental no notable dental hx.  Upper bridge and caps, lower partial:   Pulmonary asthma , former smoker,    Pulmonary exam normal breath sounds clear to auscultation       Cardiovascular hypertension, Normal cardiovascular exam Rhythm:regular Rate:Normal  MVP   Neuro/Psych PSYCHIATRIC DISORDERS Depression HOH, vertigo    GI/Hepatic negative GI ROS,   Endo/Other  diabetes, Type 2Hypothyroidism Morbid obesity  Renal/GU Renal disease (nephrolithiasis)     Musculoskeletal   Abdominal   Peds  Hematology negative hematology ROS (+)   Anesthesia Other Findings PCP note 07/09/19:   Assessment/Plan:   Diagnoses and all orders for this visit:  Type 2 diabetes mellitus with hyperglycemia, without long-term current use of insulin (CMS-HCC): Chronic, very close to goal. Discussed that we could try increasing Metformin to 1000 mg twice daily, though she would like to defer for now. Continue current dose of Metformin and Actos. - [place] POC Hemoglobin A1C; Future - [facility] POC Hemoglobin A1C  Essential hypertension: Chronic, controlled. Continue current medications.  Hypothyroidism, acquired, unspecified: Chronic, controlled based on last check. Continue levothyroxine.  Hyperlipidemia, mixed: Chronic. Statin was changed after last check; we will recheck today to see if Crestor has improved her cholesterol. - Lipid Panel W/Reflex Direct Low Density Lipoprotein (LDL) Cholesterol  Other orders: Ordered per request. - naproxen (EC NAPROSYN) 500 MG EC tablet; Take 1 tablet (500 mg total) by mouth 2  (two) times daily with meals  Follow-up in December for AWV and chronic care, sooner if needed.   Reproductive/Obstetrics                             Anesthesia Physical  Anesthesia Plan  ASA: III  Anesthesia Plan: MAC   Post-op Pain Management:    Induction: Intravenous  PONV Risk Score and Plan: 3 and Treatment may vary due to age or medical condition, TIVA and Midazolam  Airway Management Planned: Nasal Cannula  Additional Equipment:   Intra-op Plan:   Post-operative Plan:   Informed Consent: I have reviewed the patients History and Physical, chart, labs and discussed the procedure including the risks, benefits and alternatives for the proposed anesthesia with the patient or authorized representative who has indicated his/her understanding and acceptance.     Dental Advisory Given  Plan Discussed with: CRNA  Anesthesia Plan Comments:         Anesthesia Quick Evaluation

## 2019-09-27 NOTE — Anesthesia Procedure Notes (Signed)
Procedure Name: MAC Date/Time: 09/27/2019 9:16 AM Performed by: Cameron Ali, CRNA Pre-anesthesia Checklist: Patient identified, Emergency Drugs available, Suction available, Timeout performed and Patient being monitored Patient Re-evaluated:Patient Re-evaluated prior to induction Oxygen Delivery Method: Nasal cannula Placement Confirmation: positive ETCO2

## 2019-09-27 NOTE — H&P (Signed)

## 2019-09-27 NOTE — Transfer of Care (Signed)
Immediate Anesthesia Transfer of Care Note  Patient: Tina Foley  Procedure(s) Performed: CATARACT EXTRACTION PHACO AND INTRAOCULAR LENS PLACEMENT (IOC) RIGHT DIABETIC 1.80  00:25.5 (Right Eye)  Patient Location: PACU  Anesthesia Type: MAC  Level of Consciousness: awake, alert  and patient cooperative  Airway and Oxygen Therapy: Patient Spontanous Breathing and Patient connected to supplemental oxygen  Post-op Assessment: Post-op Vital signs reviewed, Patient's Cardiovascular Status Stable, Respiratory Function Stable, Patent Airway and No signs of Nausea or vomiting  Post-op Vital Signs: Reviewed and stable  Complications: No complications documented.

## 2019-09-28 ENCOUNTER — Encounter: Payer: Self-pay | Admitting: Ophthalmology

## 2020-04-04 ENCOUNTER — Other Ambulatory Visit
Admission: RE | Admit: 2020-04-04 | Discharge: 2020-04-04 | Disposition: A | Discharge status: Home / Self Care | Payer: Medicare HMO | Source: Ambulatory Visit | Attending: Student | Admitting: Student

## 2020-04-04 DIAGNOSIS — R531 Weakness: Secondary | ICD-10-CM | POA: Insufficient documentation

## 2020-04-04 DIAGNOSIS — R0789 Other chest pain: Secondary | ICD-10-CM | POA: Insufficient documentation

## 2020-04-04 DIAGNOSIS — I5033 Acute on chronic diastolic (congestive) heart failure: Secondary | ICD-10-CM | POA: Diagnosis not present

## 2020-04-04 LAB — BRAIN NATRIURETIC PEPTIDE: B Natriuretic Peptide: 63.8 pg/mL (ref 0.0–100.0)

## 2020-04-04 LAB — D-DIMER, QUANTITATIVE: D-Dimer, Quant: 0.63 ug/mL-FEU — ABNORMAL HIGH (ref 0.00–0.50)

## 2020-05-11 ENCOUNTER — Other Ambulatory Visit: Payer: Self-pay | Admitting: Student

## 2020-05-11 DIAGNOSIS — R7989 Other specified abnormal findings of blood chemistry: Secondary | ICD-10-CM

## 2020-05-18 ENCOUNTER — Ambulatory Visit
Admission: RE | Admit: 2020-05-18 | Discharge: 2020-05-18 | Disposition: A | Discharge status: Home / Self Care | Payer: Medicare HMO | Source: Ambulatory Visit | Attending: Student | Admitting: Student

## 2020-05-18 ENCOUNTER — Other Ambulatory Visit: Payer: Self-pay

## 2020-05-18 DIAGNOSIS — R7989 Other specified abnormal findings of blood chemistry: Secondary | ICD-10-CM | POA: Diagnosis present

## 2020-05-18 LAB — POCT I-STAT CREATININE: Creatinine, Ser: 1.3 mg/dL — ABNORMAL HIGH (ref 0.44–1.00)

## 2020-05-18 MED ORDER — IOHEXOL 350 MG/ML SOLN
100.0000 mL | Freq: Once | INTRAVENOUS | Status: AC | PRN
  Administered 2020-05-18: 75 mL via INTRAVENOUS

## 2020-10-18 ENCOUNTER — Encounter: Payer: Self-pay | Admitting: Surgery

## 2020-10-19 ENCOUNTER — Ambulatory Visit: Payer: Medicare HMO | Admitting: Anesthesiology

## 2020-10-19 ENCOUNTER — Encounter
Admission: RE | Disposition: A | Discharge status: Home / Self Care | Payer: Self-pay | Source: Home / Self Care | Attending: Surgery

## 2020-10-19 ENCOUNTER — Encounter: Payer: Self-pay | Admitting: Surgery

## 2020-10-19 ENCOUNTER — Ambulatory Visit
Admission: RE | Admit: 2020-10-19 | Discharge: 2020-10-19 | Disposition: A | Discharge status: Home / Self Care | Payer: Medicare HMO | Attending: Surgery | Admitting: Surgery

## 2020-10-19 DIAGNOSIS — Z885 Allergy status to narcotic agent status: Secondary | ICD-10-CM | POA: Diagnosis not present

## 2020-10-19 DIAGNOSIS — K529 Noninfective gastroenteritis and colitis, unspecified: Secondary | ICD-10-CM | POA: Insufficient documentation

## 2020-10-19 DIAGNOSIS — Z882 Allergy status to sulfonamides status: Secondary | ICD-10-CM | POA: Diagnosis not present

## 2020-10-19 DIAGNOSIS — Z87891 Personal history of nicotine dependence: Secondary | ICD-10-CM | POA: Insufficient documentation

## 2020-10-19 DIAGNOSIS — Z881 Allergy status to other antibiotic agents status: Secondary | ICD-10-CM | POA: Diagnosis not present

## 2020-10-19 DIAGNOSIS — K625 Hemorrhage of anus and rectum: Secondary | ICD-10-CM | POA: Diagnosis present

## 2020-10-19 DIAGNOSIS — K5730 Diverticulosis of large intestine without perforation or abscess without bleeding: Secondary | ICD-10-CM | POA: Insufficient documentation

## 2020-10-19 DIAGNOSIS — Z7984 Long term (current) use of oral hypoglycemic drugs: Secondary | ICD-10-CM | POA: Diagnosis not present

## 2020-10-19 DIAGNOSIS — Z886 Allergy status to analgesic agent status: Secondary | ICD-10-CM | POA: Diagnosis not present

## 2020-10-19 DIAGNOSIS — Z6841 Body Mass Index (BMI) 40.0 and over, adult: Secondary | ICD-10-CM | POA: Insufficient documentation

## 2020-10-19 DIAGNOSIS — Z888 Allergy status to other drugs, medicaments and biological substances status: Secondary | ICD-10-CM | POA: Diagnosis not present

## 2020-10-19 DIAGNOSIS — K621 Rectal polyp: Secondary | ICD-10-CM | POA: Diagnosis not present

## 2020-10-19 DIAGNOSIS — K641 Second degree hemorrhoids: Secondary | ICD-10-CM | POA: Diagnosis not present

## 2020-10-19 DIAGNOSIS — M3119 Other thrombotic microangiopathy: Secondary | ICD-10-CM | POA: Insufficient documentation

## 2020-10-19 DIAGNOSIS — Z9049 Acquired absence of other specified parts of digestive tract: Secondary | ICD-10-CM | POA: Diagnosis not present

## 2020-10-19 DIAGNOSIS — Z791 Long term (current) use of non-steroidal anti-inflammatories (NSAID): Secondary | ICD-10-CM | POA: Insufficient documentation

## 2020-10-19 DIAGNOSIS — K644 Residual hemorrhoidal skin tags: Secondary | ICD-10-CM | POA: Insufficient documentation

## 2020-10-19 HISTORY — DX: Heart failure, unspecified: I50.9

## 2020-10-19 HISTORY — PX: COLONOSCOPY WITH PROPOFOL: SHX5780

## 2020-10-19 HISTORY — DX: Unspecified hemorrhoids: K64.9

## 2020-10-19 LAB — GLUCOSE, CAPILLARY: Glucose-Capillary: 120 mg/dL — ABNORMAL HIGH (ref 70–99)

## 2020-10-19 SURGERY — COLONOSCOPY WITH PROPOFOL
Anesthesia: General

## 2020-10-19 MED ORDER — PROPOFOL 500 MG/50ML IV EMUL
INTRAVENOUS | Status: DC | PRN
  Administered 2020-10-19: 175 ug/kg/min via INTRAVENOUS

## 2020-10-19 MED ORDER — SODIUM CHLORIDE 0.9 % IV SOLN: INTRAVENOUS | Status: DC

## 2020-10-19 MED ORDER — PHENYLEPHRINE HCL (PRESSORS) 10 MG/ML IV SOLN
INTRAVENOUS | Status: DC | PRN
  Administered 2020-10-19: 200 ug via INTRAVENOUS
  Administered 2020-10-19 (×2): 100 ug via INTRAVENOUS

## 2020-10-19 MED ORDER — SPOT INK MARKER SYRINGE KIT
PACK | SUBMUCOSAL | Status: DC | PRN
  Administered 2020-10-19: 5 mL via SUBMUCOSAL

## 2020-10-19 MED ORDER — PROPOFOL 10 MG/ML IV BOLUS
INTRAVENOUS | Status: DC | PRN
  Administered 2020-10-19: 30 mg via INTRAVENOUS
  Administered 2020-10-19: 60 mg via INTRAVENOUS

## 2020-10-19 NOTE — H&P (Signed)
Subjective:  CC: Grade II hemorrhoids [K64.1]   HPI: Tina Foley is a 75 y.o. female who was referrred by Elza Rafter, MD for above. Symptoms were first noted several weeks ago. she has some rectal bleeding occurring a few times per week. Bleeding is described as slight. Pain is sharp and intermittent, confined to the perianal area, without radiation. Associated with nothing specific, exacerbated by nothing specific. Has issue with yeast infection and UTI, currently being treated.  Toilet habits: Soft occasional BMs  Patient denies a personal history of colon cancer. Patient denies a personal history of IBD. Patient has to history of polyps. Last colonoscopy in 2020, recommended to return in 3 years. Also has diverticulosis and telangiactasias within colon.  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  Past Surgical History: has a past surgical history that includes Appendectomy; Tonsillectomy; Tubal ligation; Salivary gland surgery; Cystoscopy w/ ureteroscopy w/ lithotripsy (Bilateral); Colonoscopy; colonoscopy w/removal lesions by snare (01/20/2015); injection anesthetic agent paracervicle nerve (Bilateral, 12/08/2015); colonoscopy w/removal lesions by snare (03/06/2018); colonoscopy w/biopsy (03/06/2018); and Breast biopsy (Right).  Family History: family history  includes Alcohol abuse in her brother, brother, father, paternal uncle, and paternal uncle; Allergic rhinitis in her maternal grandfather, mother, and son; Anxiety in her mother; Arthritis in her father; Asthma in her paternal aunt, paternal aunt, paternal aunt, and paternal aunt; Breast cancer in her maternal aunt, maternal aunt, and paternal aunt; Cancer in her maternal aunt, maternal aunt, maternal grandmother, and mother; Colon cancer in her maternal aunt and maternal aunt; Colon polyps in her maternal aunt, maternal aunt, maternal aunt, and maternal aunt; Coronary Artery Disease (Blocked arteries around heart) in her maternal aunt, maternal grandfather, maternal grandmother, and mother; Deep vein thrombosis (DVT or abnormal blood clot formation) in her sister and sister; Depression in her mother; Diabetes in her paternal aunt; Diabetes type II in her maternal aunt, maternal aunt, and paternal aunt; Glaucoma in her maternal grandmother and mother; Headaches in her mother; High blood pressure (Hypertension) in her paternal grandmother; Hip fracture in her mother; Hyperlipidemia (Elevated cholesterol) in her father, maternal aunt, and mother; Obesity in her maternal aunt, maternal aunt, maternal aunt, maternal aunt, maternal aunt, maternal grandmother, mother, and paternal aunt; Osteoarthritis in her father and mother; Osteoporosis (Thinning of bones) in her mother; Skin cancer in her father, maternal aunt, maternal aunt, and maternal aunt; Stroke in her paternal uncle and paternal uncle; Thyroid disease in her maternal grandmother, mother, paternal aunt, paternal aunt, paternal aunt, paternal aunt, paternal aunt, sister, sister, sister, and sister.  Social History: reports that she quit smoking about 23 years ago. She has a 30.00 pack-year smoking history. She has never used smokeless tobacco. She reports that she does not drink alcohol and does not use drugs.  Current Medications: has a current medication  list which includes the following prescription(s): acetaminophen, vitamin d3, chromium picolinate, clotrimazole, co-enzyme q-10 (ubiquinone), duloxetine, duloxetine, ec-naproxen, ferrous sulfate, fexofenadine, fluticasone propionate, furosemide, gabapentin, levothyroxine, losartan, metformin, metoprolol succinate, multivitamin, nitrofurantoin (macrocrystal-monohydrate), nystatin, pantoprazole, pioglitazone, rosuvastatin, aspirin, cyanocobalamin, and lidocaine.  Allergies:  Allergies as of 08/29/2020 -  Reviewed 08/29/2020  Allergen Reaction Noted   Cinnamon analogues Swelling 01/25/2020   Percodan [oxycodone hcl-oxycodone-asa] Other (See Comments)   Codeine Hives   Dicyclomine Hives 02/08/2011   Levofloxacin Other (See Comments)   Lisinopril Cough   Oxycodone-acetaminophen Hives 08/31/2019   Sudafed [pseudoephedrine hcl] Other (See Comments)   Sulfa (sulfonamide antibiotics) Hives and Itching   Ciprofloxacin Other (See Comments) 10/04/2012   Lipitor [atorvastatin] Muscle Pain 12/02/2014   ROS:  A 15 point review of systems was performed and pertinent positives and negatives noted in HPI  Objective:    BP (!) 152/69  Pulse 65  Ht 152.4 cm (5')  Wt (!) 116.6 kg (257 lb)  LMP (LMP Unknown)  BMI 50.19 kg/m   Constitutional : alert, appears stated age, cooperative and no distress  Lymphatics/Throat:: no asymmetry, masses, or scars  Respiratory: clear to auscultation bilaterally  Cardiovascular: regular rate and rhythm  Gastrointestinal: soft, non-tender; bowel sounds normal; no masses, no organomegaly.  Musculoskeletal: Steady gait and movement  Skin: Cool and moist, no surgical scars  Psychiatric: Normal affect, non-agitated, not confused  Genital/Rectal: Chaperone present for exam. External exam noted to have several external hemorrhoids, with one larger one that is non-tender, no ulceration. DRE revealed, normal rectal tone, with palpable internal hemorrhoid noted at anterior  portion with minimal discomfort. After obtaining verbal consent, an anoscope was inserted after prepped with lubricant and internal hemorrhoids noted on DRE confirmed visually, with no evidence of thrombosis. No other pathology such as fissures, fistulas, polyps noted. Scope withdrawn and patient tolerate procedure well.   LABS:  n/a   RADS:  Assessment:    Grade II hemorrhoids [K64.1] skin tags, internal TTP so not amenable to banding. Yeast infection Diverticulosis  Plan:   1. Grade II hemorrhoids [K64.1] Discussed risks/benefits/alternatives to surgery. Alternatives include the options of observation, medical management. Benefits include symptomatic relief. I discussed in detail and the complications related to the operation and the anesthesia, including bleeding, infection, recurrence, remote possibility of temporary or permanent fecal incontinence, poor/delayed wound healing, chronic pain, and additional procedures to address said risks. The risks of general anesthetic, if used, includes MI, CVA, sudden death or even reaction to anesthetic medications also discussed.  We also discussed typical post operative recovery which includes weeks to potentially months of anal pain, drainage, occasional bleeding, and sense of fecal urgency.   ED return precautions given for sudden increase in pain, bleeding, with possible accompanying fever, nausea, and/or vomiting.  The patient understands the risks, any and all questions were answered to the patient's satisfaction.  2. We will proceed with conservative/ expectant treatment, due size of hemorrhoids as well as concern for other sources of bleeding as noted on previous colonoscopy. scope to eval for bleeding. If that is normal, and wishes to proceed with hemorrhoidectomy, we can consider at that time. Technically more difficult due to obesity.  R/b/a discussed

## 2020-10-19 NOTE — Anesthesia Postprocedure Evaluation (Signed)
Anesthesia Post Note  Patient: Tina Foley  Procedure(s) Performed: COLONOSCOPY WITH PROPOFOL  Anesthesia Type: General Anesthetic complications: no   No notable events documented.   Last Vitals:  Vitals:   10/19/20 1559 10/19/20 1609  BP: (!) 123/48 (!) 122/53  Pulse:    Resp:    Temp:    SpO2:      Last Pain:  Vitals:   10/19/20 1609  TempSrc:   PainSc: 0-No pain                 Iran Ouch

## 2020-10-19 NOTE — Addendum Note (Signed)
Addendum  created 10/19/20 1633 by Iran Ouch, MD   Clinical Note Signed

## 2020-10-19 NOTE — Anesthesia Preprocedure Evaluation (Addendum)
Anesthesia Evaluation  Patient identified by MRN, date of birth, ID band  Reviewed: Allergy & Precautions, NPO status , Patient's Chart, lab work & pertinent test results, reviewed documented beta blocker date and time   History of Anesthesia Complications (+) PONV  Airway Mallampati: I  TM Distance: >3 FB Neck ROM: Full    Dental  (+) Missing   Pulmonary shortness of breath and with exertion, asthma (asthmatic bronchitis) , former smoker,    Pulmonary exam normal        Cardiovascular Exercise Tolerance: Poor hypertension, Pt. on medications and Pt. on home beta blockers +CHF (HFpEF, uncompensated) and + DOE   Rhythm:Regular Rate:Normal  dyspnea and BLE edema    Neuro/Psych Depression  Neuromuscular disease (neuropathy)    GI/Hepatic GERD  Medicated and Controlled,  Endo/Other  diabetes, Type 2, Oral Hypoglycemic AgentsHypothyroidism Morbid obesity  Renal/GU      Musculoskeletal   Abdominal (+) + obese,   Peds  Hematology   Anesthesia Other Findings NM Myocardial Perfusion SPECT multiple (stress and rest): (03/23/20) IMPRESSION: Normal myocardial perfusion scan no evidence of stress-induced myocardial ischemia ejection fraction of 61% conclusion negative scan   Echocardiogram 2D complete: (03/01/2020) INTERPRETATION NORMAL LEFT VENTRICULAR SYSTOLIC FUNCTION WITH MILD LVH NORMAL RIGHT VENTRICULAR SYSTOLIC FUNCTION MILD VALVULAR REGURGITATION (See above) NO VALVULAR STENOSIS LIPOMATOUS INTERATRIAL SEPTUM Closest EF: >55% (Estimated) NO PREVIOUS ECHO   Reproductive/Obstetrics                                                             Anesthesia Evaluation  Patient identified by MRN, date of birth, ID band Patient awake    Reviewed: Allergy & Precautions, H&P , NPO status , Patient's Chart, lab work & pertinent test results  History of Anesthesia Complications (+) PONV and history  of anesthetic complications  Airway Mallampati: II  TM Distance: >3 FB Neck ROM: full    Dental no notable dental hx.  Upper bridge and caps, lower partial:   Pulmonary asthma , former smoker,    Pulmonary exam normal breath sounds clear to auscultation       Cardiovascular hypertension, Normal cardiovascular exam Rhythm:regular Rate:Normal  MVP   Neuro/Psych PSYCHIATRIC DISORDERS Depression HOH, vertigo    GI/Hepatic negative GI ROS,   Endo/Other  diabetes, Type 2Hypothyroidism Morbid obesity  Renal/GU Renal disease (nephrolithiasis)     Musculoskeletal   Abdominal   Peds  Hematology negative hematology ROS (+)   Anesthesia Other Findings PCP note 07/09/19:   Assessment/Plan:   Diagnoses and all orders for this visit:  Type 2 diabetes mellitus with hyperglycemia, without long-term current use of insulin (CMS-HCC): Chronic, very close to goal. Discussed that we could try increasing Metformin to 1000 mg twice daily, though she would like to defer for now. Continue current dose of Metformin and Actos. - [place] POC Hemoglobin A1C; Future - [facility] POC Hemoglobin A1C  Essential hypertension: Chronic, controlled. Continue current medications.  Hypothyroidism, acquired, unspecified: Chronic, controlled based on last check. Continue levothyroxine.  Hyperlipidemia, mixed: Chronic. Statin was changed after last check; we will recheck today to see if Crestor has improved her cholesterol. - Lipid Panel W/Reflex Direct Low Density Lipoprotein (LDL) Cholesterol  Other orders: Ordered per request. - naproxen (EC NAPROSYN) 500 MG EC tablet; Take 1 tablet (500  mg total) by mouth 2 (two) times daily with meals  Follow-up in December for AWV and chronic care, sooner if needed.   Reproductive/Obstetrics                             Anesthesia Physical  Anesthesia Plan  ASA: III  Anesthesia Plan: MAC   Post-op Pain Management:    Induction:  Intravenous  PONV Risk Score and Plan: 3 and Treatment may vary due to age or medical condition, TIVA and Midazolam  Airway Management Planned: Nasal Cannula  Additional Equipment:   Intra-op Plan:   Post-operative Plan:   Informed Consent: I have reviewed the patients History and Physical, chart, labs and discussed the procedure including the risks, benefits and alternatives for the proposed anesthesia with the patient or authorized representative who has indicated his/her understanding and acceptance.     Dental Advisory Given  Plan Discussed with: CRNA  Anesthesia Plan Comments:         Anesthesia Quick Evaluation  Anesthesia Physical Anesthesia Plan  ASA: 3  Anesthesia Plan: General   Post-op Pain Management:    Induction: Intravenous  PONV Risk Score and Plan: Treatment may vary due to age or medical condition  Airway Management Planned: Natural Airway and Nasal Cannula  Additional Equipment:   Intra-op Plan:   Post-operative Plan:   Informed Consent: I have reviewed the patients History and Physical, chart, labs and discussed the procedure including the risks, benefits and alternatives for the proposed anesthesia with the patient or authorized representative who has indicated his/her understanding and acceptance.     Dental advisory given  Plan Discussed with: Anesthesiologist and CRNA  Anesthesia Plan Comments:         Anesthesia Quick Evaluation

## 2020-10-19 NOTE — Anesthesia Postprocedure Evaluation (Signed)
Anesthesia Post Note  Patient: Tina Foley  Procedure(s) Performed: COLONOSCOPY WITH PROPOFOL  Patient location during evaluation: PACU Anesthesia Type: General Level of consciousness: awake and alert Pain management: pain level controlled Vital Signs Assessment: post-procedure vital signs reviewed and stable Respiratory status: spontaneous breathing, nonlabored ventilation and respiratory function stable Cardiovascular status: blood pressure returned to baseline and stable Postop Assessment: no apparent nausea or vomiting Anesthetic complications: no   No notable events documented.   Last Vitals:  Vitals:   10/19/20 1559 10/19/20 1609  BP: (!) 123/48 (!) 122/53  Pulse:    Resp:    Temp:    SpO2:      Last Pain:  Vitals:   10/19/20 1609  TempSrc:   PainSc: 0-No pain                 [place] Ouch

## 2020-10-19 NOTE — Anesthesia Procedure Notes (Signed)
Date/Time: 10/19/2020 2:58 PM Performed by: Johnna Acosta, CRNA Pre-anesthesia Checklist: Patient identified, Emergency Drugs available, Suction available and Patient being monitored Patient Re-evaluated:Patient Re-evaluated prior to induction Oxygen Delivery Method: Simple face mask Preoxygenation: Pre-oxygenation with 100% oxygen Induction Type: IV induction

## 2020-10-19 NOTE — Transfer of Care (Signed)
Immediate Anesthesia Transfer of Care Note  Patient: Tina Foley  Procedure(s) Performed: COLONOSCOPY WITH PROPOFOL  Patient Location: PACU  Anesthesia Type:General  Level of Consciousness: awake  Airway & Oxygen Therapy: Patient Spontanous Breathing  Post-op Assessment: Report given to RN and Post -op Vital signs reviewed and stable  Post vital signs: Reviewed and stable  Last Vitals:  Vitals Value Taken Time  BP 97/67 10/19/20 1544  Temp 36.4 C 10/19/20 1539  Pulse 63 10/19/20 1544  Resp 16 10/19/20 1544  SpO2 95 % 10/19/20 1544    Last Pain:  Vitals:   10/19/20 1544  TempSrc:   PainSc: 0-No pain         Complications: No notable events documented.

## 2020-10-19 NOTE — Op Note (Signed)
[facility] Gastroenterology Patient Name: Tina Foley Procedure Date: 10/19/2020 2:48 PM MRN: [id number] Account #: [id number] Date of Birth: 1945/06/01 Admit Type: Outpatient Age: 75 Room: [facility] ENDO ROOM 1 Gender: Female Note Status: Finalized Instrument Name: Jasper Riling [id number] Procedure:             Colonoscopy Indications:           Screening for colorectal malignant neoplasm Providers:             Eliseo Squires MD, MD Referring MD:          [facility] (Referring MD) Medicines:             Propofol per Anesthesia Complications:         No immediate complications. Procedure:             Pre-Anesthesia Assessment:                        - After reviewing the risks and benefits, the patient                         was deemed in satisfactory condition to undergo the                         procedure in an ambulatory setting.                        After obtaining informed consent, the colonoscope was                         passed under direct vision. Throughout the procedure,                         the patient's blood pressure, pulse, and oxygen                         saturations were monitored continuously. The                         Colonoscope was introduced through the anus and                         advanced to the the cecum, identified by the ileocecal                         valve. The colonoscopy was somewhat difficult due to a                         redundant colon. Successful completion of the                         procedure was aided by applying abdominal pressure.                         The patient tolerated the procedure well. The quality                         of the bowel preparation was adequate. Findings:      Skin tags were found on perianal exam.  A 10 mm polyp was found in the distal rectum. The polyp was       semi-sessile. Biopsies were taken with a cold forceps for histology.       attempt was made to  hot snare but significant resistance felt attempting       to transect at base. this was terminated and switched to forceps for       tissue diagnosis. Area tattoted with 23mspot in immediately proximal and       distal to it      A few small-mouthed diverticula were found in the sigmoid colon.      Non-bleeding internal hemorrhoids were found during retroflexion. The       hemorrhoids were Grade II (internal hemorrhoids that prolapse but reduce       spontaneously).      A patchy area of mildly friable mucosa with no bleeding was found in the       sigmoid colon. Biopsies were taken with a cold forceps for histology.       Estimated blood loss was minimal. Impression:            - Perianal skin tags found on perianal exam.                        - One 10 mm polyp in the distal rectum. Biopsied.                        - Diverticulosis in the sigmoid colon.                        - Non-bleeding internal hemorrhoids. Recommendation:        - Discharge patient to home.                        - Resume previous diet.                        - Written discharge instructions were provided to the                         patient.                        - Await pathology results. Procedure Code(s):     --- Professional ---                        [phone] Colonoscopy, flexible; with biopsy, single or                         multiple Diagnosis Code(s):     --- Professional ---                        Z12.11, Encounter for screening for malignant neoplasm                         of colon                        K62.1, Rectal polyp                        K64.1,  Second degree hemorrhoids                        K64.4, Residual hemorrhoidal skin tags                        K57.30, Diverticulosis of large intestine without                         perforation or abscess without bleeding CPT copyright 2019 [employer]. All rights reserved. The codes documented in this report are preliminary  and upon coder review may  be revised to meet current compliance requirements. Dr. Sheppard Penton, MD Eliseo Squires MD, MD 10/19/2020 3:39:11 PM This report has been signed electronically. Number of Addenda: 0 Note Initiated On: 10/19/2020 2:48 PM Scope Withdrawal Time: 0 hours 23 minutes 33 seconds  Total Procedure Duration: 0 hours 34 minutes 9 seconds  Estimated Blood Loss:  Estimated blood loss was minimal.      [facility]

## 2020-10-23 LAB — SURGICAL PATHOLOGY

## 2020-10-24 ENCOUNTER — Other Ambulatory Visit: Payer: Self-pay | Admitting: Surgery

## 2020-10-24 DIAGNOSIS — C20 Malignant neoplasm of rectum: Secondary | ICD-10-CM

## 2020-11-06 ENCOUNTER — Ambulatory Visit: Payer: Medicare HMO

## 2020-11-10 ENCOUNTER — Ambulatory Visit
Admission: RE | Admit: 2020-11-10 | Discharge: 2020-11-10 | Disposition: A | Discharge status: Home / Self Care | Payer: Medicare HMO | Source: Ambulatory Visit | Attending: Surgery | Admitting: Surgery

## 2020-11-10 ENCOUNTER — Other Ambulatory Visit: Payer: Self-pay

## 2020-11-10 DIAGNOSIS — C20 Malignant neoplasm of rectum: Secondary | ICD-10-CM | POA: Diagnosis present

## 2020-11-23 ENCOUNTER — Other Ambulatory Visit: Payer: Self-pay | Admitting: Surgery

## 2020-11-23 ENCOUNTER — Other Ambulatory Visit: Payer: Self-pay

## 2020-11-23 ENCOUNTER — Ambulatory Visit
Admission: RE | Admit: 2020-11-23 | Discharge: 2020-11-23 | Disposition: A | Discharge status: Home / Self Care | Payer: Medicare HMO | Source: Ambulatory Visit | Attending: Surgery | Admitting: Surgery

## 2020-11-23 DIAGNOSIS — C20 Malignant neoplasm of rectum: Secondary | ICD-10-CM | POA: Diagnosis not present

## 2020-11-23 MED ORDER — IOHEXOL 300 MG/ML  SOLN
80.0000 mL | Freq: Once | INTRAMUSCULAR | Status: AC | PRN
  Administered 2020-11-23: 80 mL via INTRAVENOUS

## 2020-11-28 ENCOUNTER — Other Ambulatory Visit: Payer: Medicare HMO

## 2022-08-09 IMAGING — CT CT ABDOMEN W/ CM
3 of 5 series · 12 of 46 positions shown, 17 images · IV contrast (omnipaque)
Comparison: CT chest, 05/18/2020, CT chest abdomen pelvis,
01/04/2014

CLINICAL DATA: New diagnosis rectal cancer, chest and abdomen
staging

EXAM:
CT CHEST AND ABDOMEN WITH CONTRAST
TECHNIQUE: Multidetector CT imaging of the chest and abdomen was performed
following the standard protocol during bolus administration of
intravenous contrast.
CONTRAST:  80mL OMNIPAQUE IOHEXOL 300 MG/ML SOLN, additional oral
enteric contrast

[Series 2: axials cap 5.00 · axial · 0.90mm/px · z∈[-1338,-973]mm · 8 of 95 slices shown, 13 images]
[im 11/95  soft-tissue]
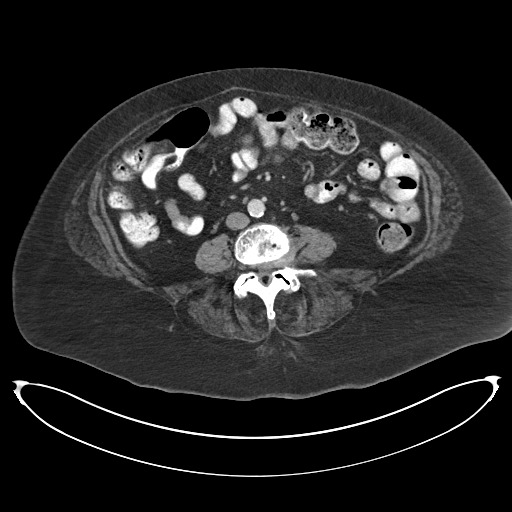
[im 11/95  bone]
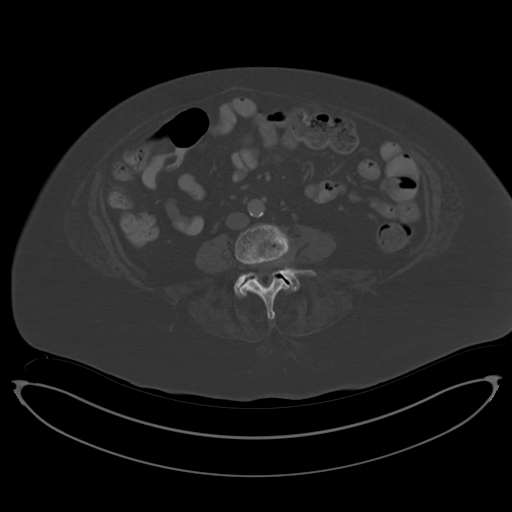
[im 21/95  soft-tissue]
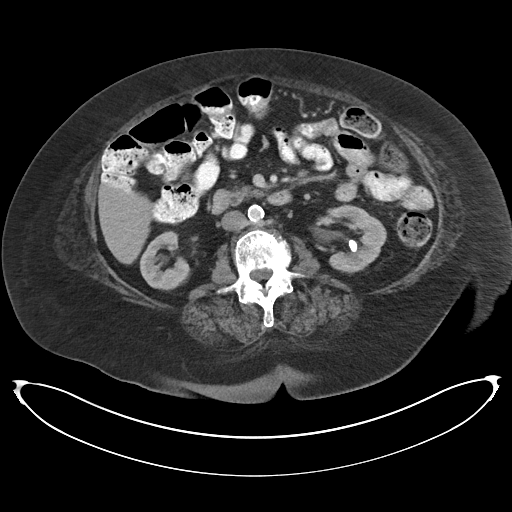
[im 32/95  soft-tissue]
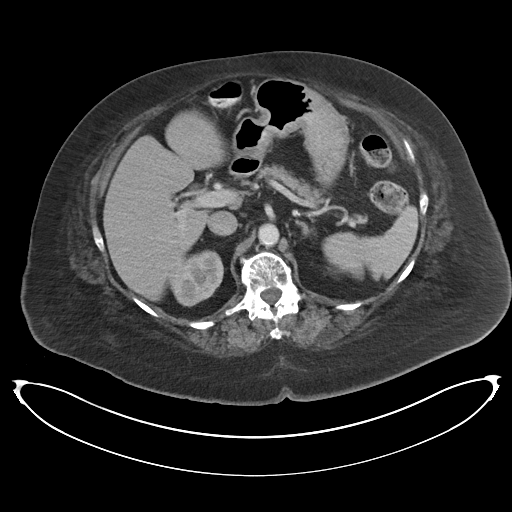
[im 42/95  soft-tissue]
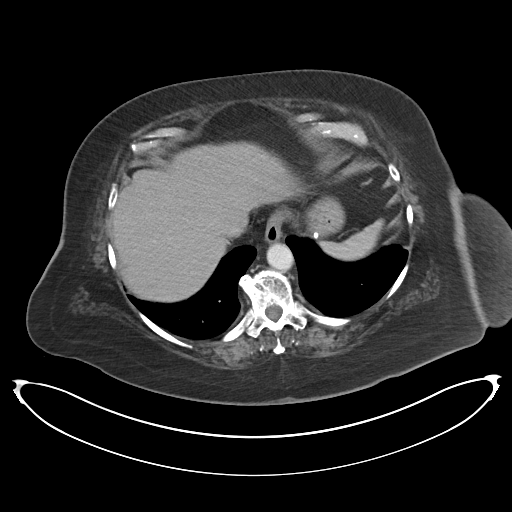
[im 53/95  soft-tissue]
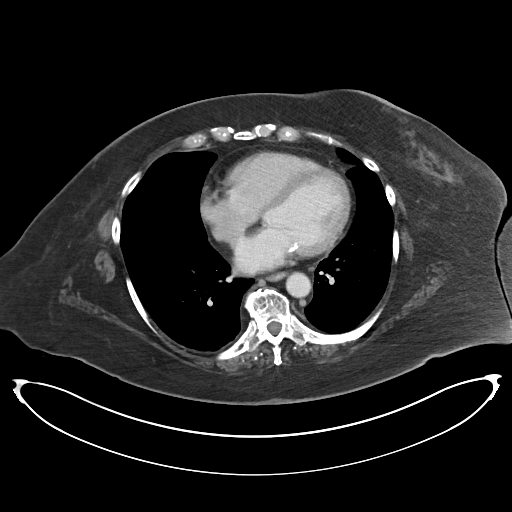
[im 53/95  lung]
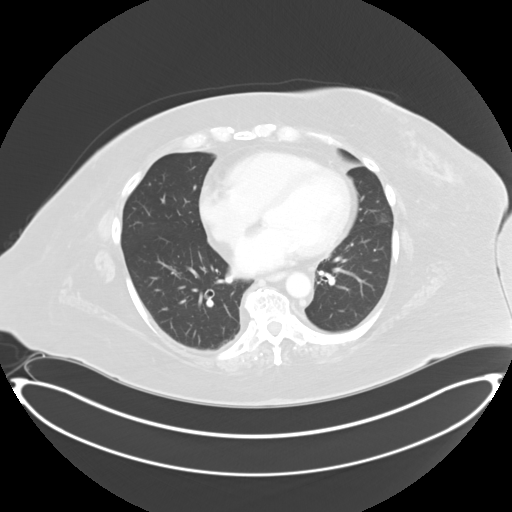
[im 63/95  soft-tissue]
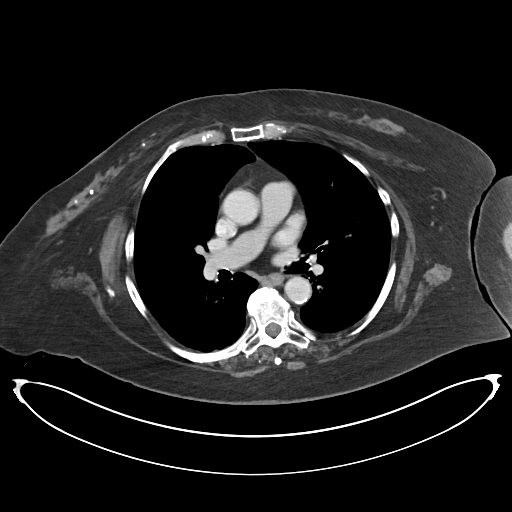
[im 63/95  lung]
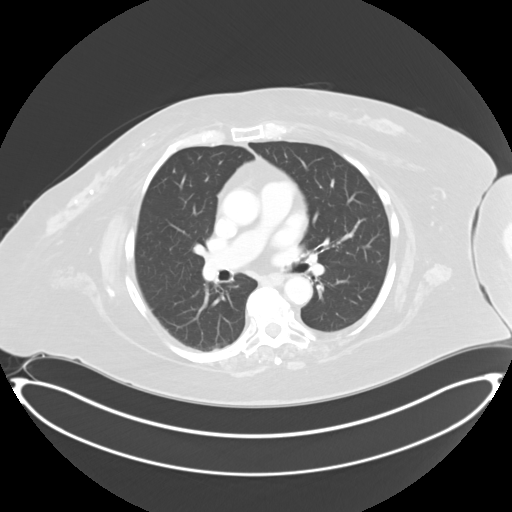
[im 74/95  soft-tissue]
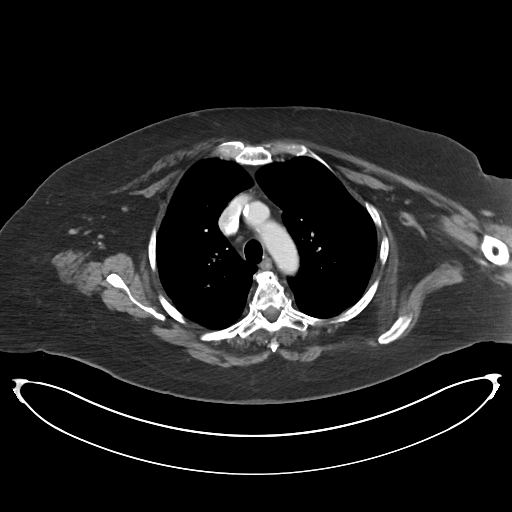
[im 74/95  lung]
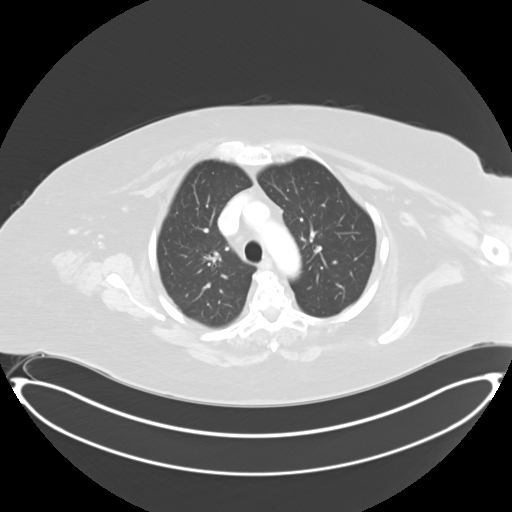
[im 84/95  soft-tissue]
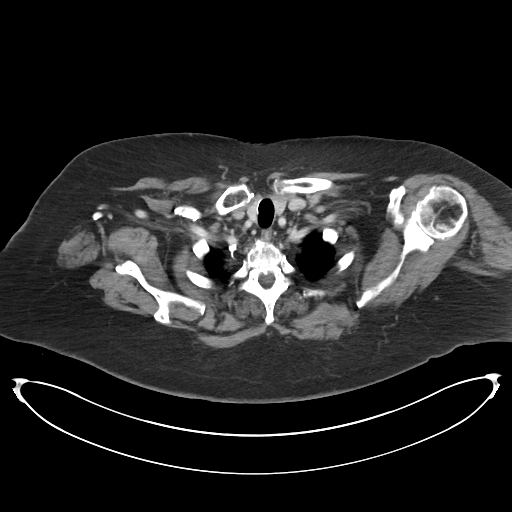
[im 84/95  lung]
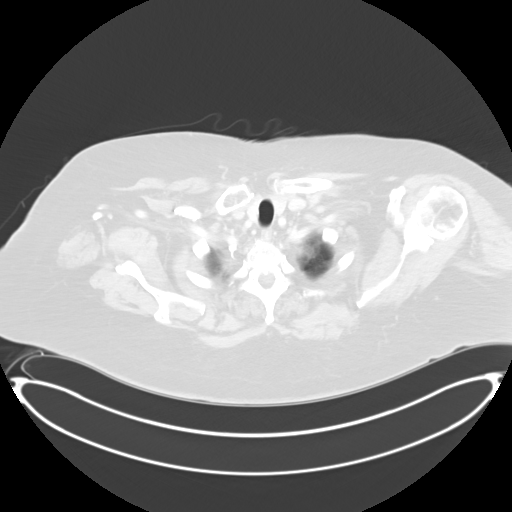

[Series 4: coronals cap 2.00 cor · coronal · 0.90mm/px · 3 of 164 slices shown]
[im 55/164  soft-tissue]
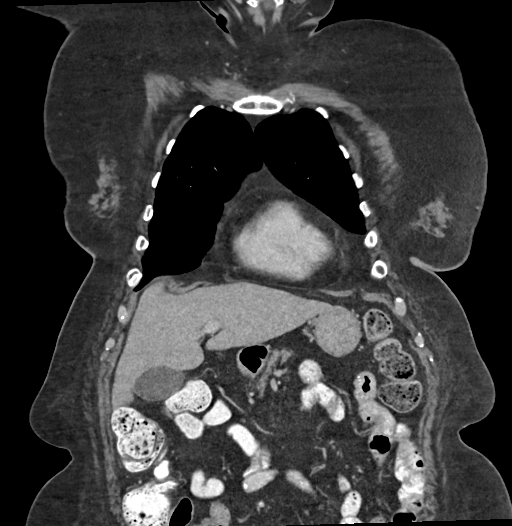
[im 73/164  soft-tissue]
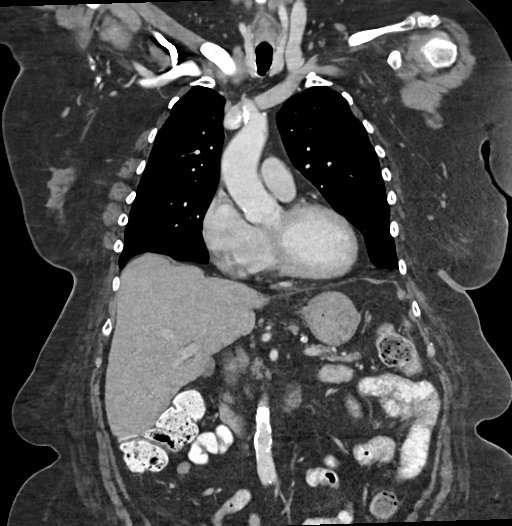
[im 91/164  soft-tissue]
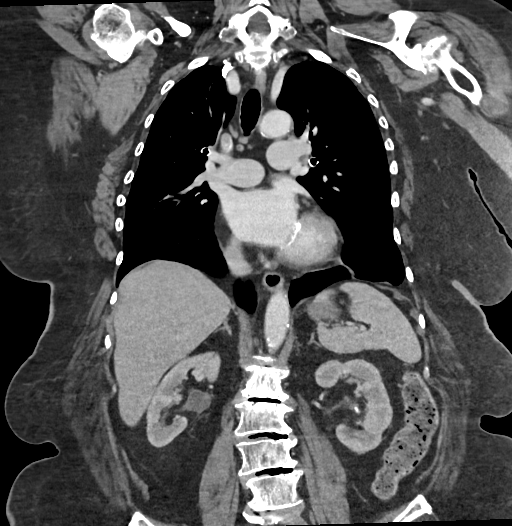

[Series 6: sagittals cap 2.00 sag · sagittal · 0.64mm/px · 1 of 231 slices shown]
[im 77/231  soft-tissue]
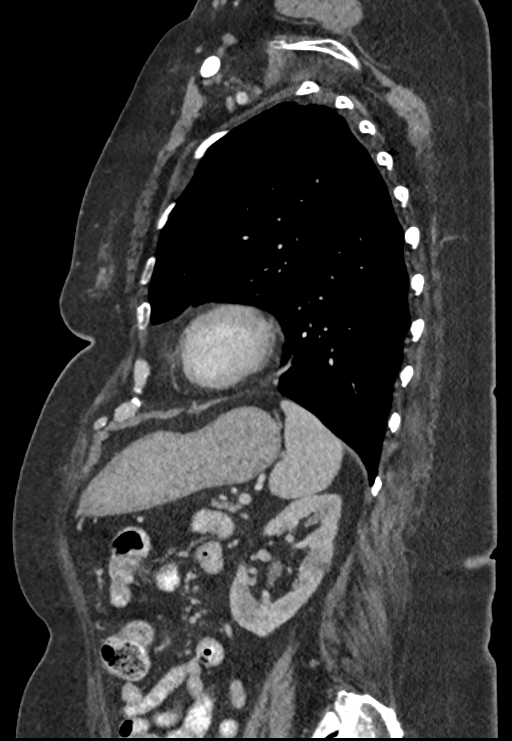

[12 of 46 positions shown; findings below may reference images not displayed]

FINDINGS: CT CHEST FINDINGS

Cardiovascular: Aortic atherosclerosis. Normal heart size. Scattered
left coronary artery calcifications. No pericardial effusion.

Mediastinum/Nodes: No enlarged mediastinal, hilar, or axillary lymph
nodes. Thyroid gland, trachea, and esophagus demonstrate no
significant findings.

Lungs/Pleura: Lungs are clear. No pleural effusion or pneumothorax.

Musculoskeletal: No chest wall mass or suspicious bone lesions
identified.

CT ABDOMEN FINDINGS

Hepatobiliary: No focal liver abnormality is seen. No gallstones,
gallbladder wall thickening, or biliary dilatation.

Pancreas: Unremarkable. No pancreatic ductal dilatation or
surrounding inflammatory changes.

Spleen: Normal in size without focal abnormality.

Adrenals/Urinary Tract: Adrenal glands are unremarkable. Bilateral
nonobstructive renal calculi. No ureteral calculi or hydronephrosis.
Bladder is unremarkable.

Stomach/Bowel: Stomach is within normal limits. No evidence of bowel
wall thickening, distention, or inflammatory changes.

Vascular/Lymphatic: Aortic atherosclerosis. No enlarged abdominal or
pelvic lymph nodes.

Other: No abdominal wall hernia or abnormality. No abdominopelvic
ascites.

Musculoskeletal: No fracture is seen.
IMPRESSION: 1. No evidence of lymphadenopathy or metastatic disease in the chest
or abdomen.
2. Bilateral nonobstructive nephrolithiasis.
3. Coronary artery disease.

Aortic Atherosclerosis ([id number]).

## 2022-12-31 ENCOUNTER — Encounter: Payer: Medicare Other | Attending: Physician Assistant | Admitting: Physician Assistant

## 2022-12-31 DIAGNOSIS — E11622 Type 2 diabetes mellitus with other skin ulcer: Secondary | ICD-10-CM | POA: Diagnosis not present

## 2022-12-31 DIAGNOSIS — I110 Hypertensive heart disease with heart failure: Secondary | ICD-10-CM | POA: Diagnosis not present

## 2022-12-31 DIAGNOSIS — I5042 Chronic combined systolic (congestive) and diastolic (congestive) heart failure: Secondary | ICD-10-CM | POA: Insufficient documentation

## 2022-12-31 DIAGNOSIS — M4628 Osteomyelitis of vertebra, sacral and sacrococcygeal region: Secondary | ICD-10-CM | POA: Diagnosis not present

## 2022-12-31 DIAGNOSIS — L89154 Pressure ulcer of sacral region, stage 4: Secondary | ICD-10-CM | POA: Insufficient documentation

## 2022-12-31 DIAGNOSIS — B354 Tinea corporis: Secondary | ICD-10-CM | POA: Insufficient documentation

## 2023-01-02 NOTE — Progress Notes (Signed)
AMYRIE, FEINSTEIN ([id number]) [id number] Page 1 of 9 Visit Report for 12/31/2022 Chief Complaint Document Details Patient Name: Date of Service: Tina Foley, Tina Foley 12/31/2022 2:00 PM Medical Record Number: [id number] Patient Account Number: [id number] Date of Birth/Sex: Treating RN: 1945/03/29 (77 y.o. Ginette Pitman Primary Care Provider: Cleon Dew Other Clinician: Referring Provider: Treating Provider/Extender: Corliss Skains in Treatment: 0 Information Obtained from: Patient Chief Complaint Midline sacral ulcer Electronic Signature(s) Signed: 12/31/2022 3:05:56 PM By: Allen Derry PA-Foley Entered By: Allen Derry on 12/31/2022 15:05:56 -------------------------------------------------------------------------------- HPI Details Patient Name: Date of Service: Tina Chase. 12/31/2022 2:00 PM Medical Record Number: [id number] Patient Account Number: [id number] Date of Birth/Sex: Treating RN: [birth year] (77 y.o. Ginette Pitman Primary Care Provider: Cleon Dew Other Clinician: Referring Provider: Treating Provider/Extender: Corliss Skains in Treatment: 0 History of Present Illness HPI Description: 12-31-2022 upon evaluation today patient presents for initial inspection here in our clinic concerning a wound that she has over the sacral area with the bilateral buttocks actually being involved with some irritation that spreads quite expensively around this area. With that being said I do believe that the wound itself is unfortunately very tiny at the opening but has a much larger presents internal. This especially goes down into the 6:00 location where that seems to be the deepest part at the moment. This widespread area of erythema seems to be yeast related to me external and I believe that she would benefit from an oral antifungal at this point. This wound has been present for about a year. It was  noted in September 2024 when she had a CT that she did have the below findings: CT Findings 10/12/22: IMPRESSION: Limited visualization due to motion and patient unable to tolerate additional sequences. Sacral decubitus ulcer extending to the bone with fluid collection and gas and osteomyelitis of the adjacent coccyx. Unfortunately I am unsure as to how effectively the osteomyelitis was treated or if it is still remaining to some degree. She was in the hospital from October 11, 2022 through October 23, 2022 I just cannot find records of how long she may have had antibiotic treatment following. There is also some evidence of a recent urine culture just this month on the 11th I think it was reported out which showed a fairly resistant organism but I am unsure what to be done in that regard it did not look like there were any oral antibiotic options so it was something that would be more IV antibiotics and not even certain that that has been done I tried to talk to the patient and her niece about this but it seems like there is some confusion about what exactly has been going on there. Tina Foley, Tina Foley ([id number]) [id number] Page 2 of 9 Patient does have a history of diabetes mellitus type 2, hypertension, and congestive heart failure. With that being said the diabetes most recent hemoglobin A1c was 6.0 which is excellent and that was actually the August 07, 2022 I am very pleased in that regard. Electronic Signature(s) Signed: 12/31/2022 5:25:36 PM By: Allen Derry PA-Foley Entered By: Allen Derry on 12/31/2022 17:25:35 -------------------------------------------------------------------------------- Physical Exam Details Patient Name: Date of Service: Tina Foley, Tina Foley 12/31/2022 2:00 PM Medical Record Number: [id number] Patient Account Number: [id number] Date of Birth/Sex: Treating RN: [birth year] (77 y.o. Ginette Pitman Primary Care Provider: Cleon Dew Other  Clinician: Referring Provider: Treating Provider/Extender: Corliss Skains in Treatment: 0 Constitutional sitting or  standing blood pressure is within target range for patient.. pulse regular and within target range for patient.Marland Kitchen respirations regular, non-labored and within target range for patient.Marland Kitchen temperature within target range for patient.. Obese and well-hydrated in no acute distress. Eyes conjunctiva clear no eyelid edema noted. pupils equal round and reactive to light and accommodation. Ears, Nose, Mouth, and Throat no gross abnormality of ear auricles or external auditory canals. normal hearing noted during conversation. mucus membranes moist. Respiratory normal breathing without difficulty. Musculoskeletal normal gait and posture. no significant deformity or arthritic changes, no loss or range of motion, no clubbing. Psychiatric this patient is able to make decisions and demonstrates good insight into disease process. Alert and Oriented x 3. pleasant and cooperative. Notes Upon inspection patient's wound bed actually showed signs again of being a very small opening with unfortunately a larger presents internally. I discussed with her today that this is a very difficult wound type to heal and to be honest with the evidence of the osteomyelitis in the past and again I am unsure exactly how effectively this was cleared and/or treated that this may be something that would continue to be problematic for her. Again the hyperbarics is not out of the question here but it would be something that would be fairly extensive as far as going forward with that she would have to be here for quite some time that something that may be considered going forward but it would have to be determined that she does indeed have chronic osteomyelitis still. Electronic Signature(s) Signed: 12/31/2022 5:26:38 PM By: Allen Derry PA-Foley Entered By: Allen Derry on 12/31/2022 17:26:38 Aron,  Tina Foley ([id number]) [id number] Page 3 of 9 -------------------------------------------------------------------------------- Physician Orders Details Patient Name: Date of Service: Tina Foley, Tina Foley 12/31/2022 2:00 PM Medical Record Number: [id number] Patient Account Number: [id number] Date of Birth/Sex: Treating RN: December 06, 1945 (77 y.o. Ginette Pitman Primary Care Provider: Cleon Dew Other Clinician: Referring Provider: Treating Provider/Extender: Corliss Skains in Treatment: 0 The following information was scribed by: Midge Aver The information was scribed for: Allen Derry Verbal / Phone Orders: No Diagnosis Coding ICD-10 Coding Code Description L89.154 Pressure ulcer of sacral region, stage 4 M46.28 Osteomyelitis of vertebra, sacral and sacrococcygeal region B35.4 Tinea corporis E11.622 Type 2 diabetes mellitus with other skin ulcer I10 Essential (primary) hypertension I50.42 Chronic combined systolic (congestive) and diastolic (congestive) heart failure Follow-up Appointments Return Appointment in 2 weeks. [facility] [phone] Victorino Dike [phone] Fax [phone] om CONTINUE Home Health for wound care. May utilize formulary equivalent dressing for wound treatment orders unless otherwise specified. Home Health Nurse may visit PRN to address patients wound care needs. - Hydrofera blue rope packed into the wound Bathing/ Shower/ Hygiene Wash wounds with antibacterial soap and water. Wound Treatment Wound #1 - Gluteus Wound Laterality: Midline Cleanser: Soap and Water 1 x Per Day/15 Days Discharge Instructions: Gently cleanse wound with antibacterial soap, rinse and pat dry prior to dressing wounds Cleanser: Vashe 5.8 (oz) 1 x Per Day/15 Days Discharge Instructions: Use vashe 5.8 (oz) as directed Prim Dressing: Hydrofera blue rope (Home Health) 1 x Per Day/15  Days ary Secondary Dressing: ABD Pad 5x9 (in/in) 1 x Per Day/15 Days Discharge Instructions: Cover with ABD pad Secured With: Paper Tape, 1x 1.5 (in/yd) 1 x Per Day/15 Days Patient Medications llergies: ciprofloxacin, codeine, dicyclomine, levofloxacin, Lipitor, Percocet, Sudafed, Sulfa, Cinnamon A Notifications Medication Indication Start End 12/31/2022 fluconazole DOSE 1 -  oral 150 mg tablet - 1 tablet oral taken 1 time per week for 4 weeks Electronic Signature(s) Signed: 12/31/2022 5:30:04 PM By: Allen Derry PA-Foley Entered By: Allen Derry on 12/31/2022 17:30:04 Tina Foley, Tina Foley ([id number]) [id number] Page 4 of 9 -------------------------------------------------------------------------------- Problem List Details Patient Name: Date of Service: SKYANNA, BLEHM 12/31/2022 2:00 PM Medical Record Number: [id number] Patient Account Number: [id number] Date of Birth/Sex: Treating RN: [birth year] (77 y.o. Ginette Pitman Primary Care Provider: Cleon Dew Other Clinician: Referring Provider: Treating Provider/Extender: Corliss Skains in Treatment: 0 Active Problems ICD-10 Encounter Code Description Active Date MDM Diagnosis L89.154 Pressure ulcer of sacral region, stage 4 12/31/2022 No Yes M46.28 Osteomyelitis of vertebra, sacral and sacrococcygeal region 12/31/2022 No Yes B35.4 Tinea corporis 12/31/2022 No Yes E11.622 Type 2 diabetes mellitus with other skin ulcer 12/31/2022 No Yes I10 Essential (primary) hypertension 12/31/2022 No Yes I50.42 Chronic combined systolic (congestive) and diastolic (congestive) heart failure 12/31/2022 No Yes Inactive Problems Resolved Problems Electronic Signature(s) Signed: 12/31/2022 5:34:59 PM By: Allen Derry PA-Foley Signed: 01/01/2023 4:53:00 PM By: Midge Aver MSN RN CNS WTA Previous Signature: 12/31/2022 3:06:29 PM Version By: Allen Derry PA-Foley Previous Signature: 12/31/2022 3:05:38 PM  Version By: Allen Derry PA-Foley Entered By: Midge Aver on 12/31/2022 15:20:35 Botkin, Tina Foley ([id number]) [id number] Page 5 of 9 -------------------------------------------------------------------------------- Progress Note Details Patient Name: Date of Service: Tina Foley, Tina Foley 12/31/2022 2:00 PM Medical Record Number: [id number] Patient Account Number: [id number] Date of Birth/Sex: Treating RN: [birth year] (77 y.o. Ginette Pitman Primary Care Provider: Cleon Dew Other Clinician: Referring Provider: Treating Provider/Extender: Corliss Skains in Treatment: 0 Subjective Chief Complaint Information obtained from Patient Midline sacral ulcer History of Present Illness (HPI) 12-31-2022 upon evaluation today patient presents for initial inspection here in our clinic concerning a wound that she has over the sacral area with the bilateral buttocks actually being involved with some irritation that spreads quite expensively around this area. With that being said I do believe that the wound itself is unfortunately very tiny at the opening but has a much larger presents internal. This especially goes down into the 6:00 location where that seems to be the deepest part at the moment. This widespread area of erythema seems to be yeast related to me external and I believe that she would benefit from an oral antifungal at this point. This wound has been present for about a year. It was noted in September 2024 when she had a CT that she did have the below findings: CT Findings 10/12/22: IMPRESSION: Limited visualization due to motion and patient unable to tolerate additional sequences. Sacral decubitus ulcer extending to the bone with fluid collection and gas and osteomyelitis of the adjacent coccyx. Unfortunately I am unsure as to how effectively the osteomyelitis was treated or if it is still remaining to some degree. She was in the hospital  from October 11, 2022 through October 23, 2022 I just cannot find records of how long she may have had antibiotic treatment following. There is also some evidence of a recent urine culture just this month on the 11th I think it was reported out which showed a fairly resistant organism but I am unsure what to be done in that regard it did not look like there were any oral antibiotic options so it was something that would be more IV antibiotics and not even certain that that has been done I tried to talk to the patient and her niece about this  but it seems like there is some confusion about what exactly has been going on there. Patient does have a history of diabetes mellitus type 2, hypertension, and congestive heart failure. With that being said the diabetes most recent hemoglobin A1c was 6.0 which is excellent and that was actually the August 07, 2022 I am very pleased in that regard. Patient History Information obtained from Patient. Allergies ciprofloxacin, codeine, dicyclomine, levofloxacin, Lipitor, Percocet, Sudafed, Sulfa, Cinnamon Social History Never smoker, Marital Status - Divorced, Alcohol Use - Never, Drug Use - No History, Caffeine Use - Rarely. Medical History Respiratory Patient has history of Asthma Cardiovascular Patient has history of Congestive Heart Failure, Hypertension Endocrine Patient has history of Type II Diabetes Neurologic Patient has history of Neuropathy Oncologic Patient has history of Received Chemotherapy - completed, Received Radiation - completed Patient is treated with Controlled Diet. Blood sugar results noted at the following times: Lunch - 160. Review of Systems (ROS) Constitutional Symptoms (General Health) Denies complaints or symptoms of Fatigue, Fever, Chills, Marked Weight Change. Eyes Denies complaints or symptoms of Dry Eyes, Vision Changes, Glasses / Contacts. Ear/Nose/Mouth/Throat Denies complaints or symptoms of Difficult clearing ears,  Sinusitis. Gastrointestinal Denies complaints or symptoms of Frequent diarrhea, Nausea, Vomiting. Genitourinary Denies complaints or symptoms of Kidney failure/ Dialysis, Incontinence/dribbling. Immunological Denies complaints or symptoms of Hives, Itching. Integumentary (Skin) Denies complaints or symptoms of Wounds, Bleeding or bruising tendency, Breakdown, Swelling. Musculoskeletal Denies complaints or symptoms of Muscle Pain, Muscle Weakness. Oncologic colon cancer stage 3 SHEREDA, Tina Foley ([id number]) [id number] Page 6 of 9 Objective Constitutional sitting or standing blood pressure is within target range for patient.. pulse regular and within target range for patient.Marland Kitchen respirations regular, non-labored and within target range for patient.Marland Kitchen temperature within target range for patient.. Obese and well-hydrated in no acute distress. Vitals Time Taken: 2:30 PM, Temperature: 97.5 F, Pulse: 84 bpm, Respiratory Rate: 18 breaths/min, Blood Pressure: 141/73 mmHg. Eyes conjunctiva clear no eyelid edema noted. pupils equal round and reactive to light and accommodation. Ears, Nose, Mouth, and Throat no gross abnormality of ear auricles or external auditory canals. normal hearing noted during conversation. mucus membranes moist. Respiratory normal breathing without difficulty. Musculoskeletal normal gait and posture. no significant deformity or arthritic changes, no loss or range of motion, no clubbing. Psychiatric this patient is able to make decisions and demonstrates good insight into disease process. Alert and Oriented x 3. pleasant and cooperative. General Notes: Upon inspection patient's wound bed actually showed signs again of being a very small opening with unfortunately a larger presents internally. I discussed with her today that this is a very difficult wound type to heal and to be honest with the evidence of the osteomyelitis in the past and again I  am unsure exactly how effectively this was cleared and/or treated that this may be something that would continue to be problematic for her. Again the hyperbarics is not out of the question here but it would be something that would be fairly extensive as far as going forward with that she would have to be here for quite some time that something that may be considered going forward but it would have to be determined that she does indeed have chronic osteomyelitis still. Integumentary (Hair, Skin) Wound #1 status is Open. Original cause of wound was Gradually Appeared. The date acquired was: 12/30/2021. The wound is located on the Midline Gluteus. The wound measures 0.2cm length x 0.2cm width x 3.5cm depth; 0.031cm^2 area and 0.11cm^3 volume. There is  Fat Layer (Subcutaneous Tissue) exposed. There is a large amount of serous drainage noted. There is medium (34-66%) red granulation within the wound bed. There is no necrotic tissue within the wound bed. Assessment Active Problems ICD-10 Pressure ulcer of sacral region, stage 4 Osteomyelitis of vertebra, sacral and sacrococcygeal region Tinea corporis Type 2 diabetes mellitus with other skin ulcer Essential (primary) hypertension Chronic combined systolic (congestive) and diastolic (congestive) heart failure Plan Follow-up Appointments: Return Appointment in 2 weeks. [facility] [phone] Victorino Dike [phone] Fax [phone] om CONTINUE Home Health for wound care. May utilize formulary equivalent dressing for wound treatment orders unless otherwise specified. Home Health Nurse may visit PRN to address patients wound care needs. - Hydrofera blue rope packed into the wound Bathing/ Shower/ Hygiene: Wash wounds with antibacterial soap and water. WOUND #1: - Gluteus Wound Laterality: Midline Cleanser: Soap and Water 1 x Per Day/15 Days Discharge Instructions: Gently cleanse wound with antibacterial  soap, rinse and pat dry prior to dressing wounds Cleanser: Vashe 5.8 (oz) 1 x Per Day/15 Days Discharge Instructions: Use vashe 5.8 (oz) as directed Prim Dressing: Hydrofera blue rope (Home Health) 1 x Per Day/15 Days ary Secondary Dressing: ABD Pad 5x9 (in/in) 1 x Per Day/15 Days Discharge Instructions: Cover with ABD pad Secured With: Paper Tape, 1x 1.5 (in/yd) 1 x Per Day/15 Days 1. Based on what I am seeing I do believe that the patient would benefit from switching to [facility] Blue rope to pack into the area I think that skin to be much easier to do at this point. 2. I am going to recommend as well that the patient should continue to monitor for any signs of infection or worsening. I do not see any evidence of systemic infection that is obvious although there is some question about a UTI but again that is out of my realm I been trying to investigate today what exactly was going on in that regard my suggestion was to have her to go to urgent care as was directed by the previous provider that they spoke with. 3. I am also going to recommend at this time that the patient should continue to utilize the Diflucan which I am going to send into the pharmacy for her. Tina Foley, Tina Foley ([id number]) [id number] Page 7 of 9 4. I do think the patient needs to make sure to offload appropriately I did recommend that she should not be using a cold/ice pack on her gluteal region for extended periods of time as a 5 to 10 minutes max and again I understand that it does give her some relief but at the same time of worried about this causing burns which could worsen the situation overall. We will see patient back for reevaluation in 1 week here in the clinic. If anything worsens or changes patient will contact our office for additional recommendations. Electronic Signature(s) Signed: 12/31/2022 5:30:42 PM By: Allen Derry PA-Foley Entered By: Allen Derry on 12/31/2022  17:30:41 -------------------------------------------------------------------------------- ROS/PFSH Details Patient Name: Date of Service: Tina Chase. 12/31/2022 2:00 PM Medical Record Number: [id number] Patient Account Number: [id number] Date of Birth/Sex: Treating RN: [birth year] (77 y.o. Ginette Pitman Primary Care Provider: Cleon Dew Other Clinician: Referring Provider: Treating Provider/Extender: Corliss Skains in Treatment: 0 Information Obtained From Patient Constitutional Symptoms (General Health) Complaints and Symptoms: Negative for: Fatigue; Fever; Chills; Marked Weight Change Eyes Complaints and Symptoms: Negative for: Dry Eyes; Vision Changes; Glasses /  Contacts Ear/Nose/Mouth/Throat Complaints and Symptoms: Negative for: Difficult clearing ears; Sinusitis Gastrointestinal Complaints and Symptoms: Negative for: Frequent diarrhea; Nausea; Vomiting Genitourinary Complaints and Symptoms: Negative for: Kidney failure/ Dialysis; Incontinence/dribbling Immunological Complaints and Symptoms: Negative for: Hives; Itching Integumentary (Skin) Complaints and Symptoms: Negative for: Wounds; Bleeding or bruising tendency; Breakdown; Swelling Musculoskeletal Complaints and Symptoms: Negative for: Muscle Pain; Muscle Weakness Tina Foley, Tina Foley ([id number]) [id number] Page 8 of 9 Respiratory Medical History: Positive for: Asthma Cardiovascular Medical History: Positive for: Congestive Heart Failure; Hypertension Endocrine Medical History: Positive for: Type II Diabetes Treated with: Diet Blood sugar testing results: Lunch: 160 Neurologic Medical History: Positive for: Neuropathy Oncologic Complaints and Symptoms: Review of System Notes: colon cancer stage 3 Medical History: Positive for: Received Chemotherapy - completed; Received Radiation - completed Immunizations Pneumococcal  Vaccine: Received Pneumococcal Vaccination: Yes Received Pneumococcal Vaccination On or After 60th Birthday: Yes Implantable Devices None Family and Social History Never smoker; Marital Status - Divorced; Alcohol Use: Never; Drug Use: No History; Caffeine Use: Rarely Electronic Signature(s) Signed: 12/31/2022 5:34:59 PM By: Allen Derry PA-Foley Signed: 01/01/2023 4:53:00 PM By: Midge Aver MSN RN CNS WTA Entered By: Midge Aver on 12/31/2022 14:37:00 -------------------------------------------------------------------------------- SuperBill Details Patient Name: Date of Service: Tina Chase 12/31/2022 Medical Record Number: [id number] Patient Account Number: [id number] Date of Birth/Sex: Treating RN: 03-04-1945 (77 y.o. Ginette Pitman Primary Care Provider: Cleon Dew Other Clinician: Referring Provider: Treating Provider/Extender: Corliss Skains in Treatment: 0 Diagnosis Coding ICD-10 Codes Tina Foley, Tina Foley ([id number]) [id number] Page 9 of 9 Code Description L89.154 Pressure ulcer of sacral region, stage 4 M46.28 Osteomyelitis of vertebra, sacral and sacrococcygeal region B35.4 Tinea corporis E11.622 Type 2 diabetes mellitus with other skin ulcer I10 Essential (primary) hypertension I50.42 Chronic combined systolic (congestive) and diastolic (congestive) heart failure Facility Procedures : CPT4 Code: [id number] Description: 99214 - WOUND CARE VISIT-LEV 4 EST PT Modifier: Quantity: 1 Physician Procedures : CPT4 Code Description Modifier [id number] - WC PHYS LEVEL 4 - NEW PT ICD-10 Diagnosis Description L89.154 Pressure ulcer of sacral region, stage 4 M46.28 Osteomyelitis of vertebra, sacral and sacrococcygeal region B35.4 Tinea corporis E11.622 Type 2  diabetes mellitus with other skin ulcer Quantity: 1 Electronic Signature(s) Signed: 12/31/2022 5:31:08 PM By: Allen Derry PA-Foley Previous Signature: 12/31/2022  5:20:16 PM Version By: Midge Aver MSN RN CNS WTA Entered By: Allen Derry on 12/31/2022 17:31:08

## 2023-01-02 NOTE — Progress Notes (Signed)
LAVETT, LIPS ([id number]) [id number] Page 1 of 10 Visit Report for 12/31/2022 Allergy List Details Patient Name: Date of Service: Tina Foley, Tina Foley 12/31/2022 2:00 PM Medical Record Number: [id number] Patient Account Number: [id number] Date of Birth/Sex: Treating RN: 1945/04/12 (77 y.o. Tina Foley Primary Care Tina Foley: Tina Foley Other Clinician: Referring Tina Foley: Treating Tina Foley/Extender: Tina Foley in Treatment: 0 Allergies Active Allergies ciprofloxacin codeine dicyclomine levofloxacin Lipitor Percocet Sudafed Sulfa Type: Medication Cinnamon Type: Food Allergy Notes Electronic Signature(Foley) Signed: 01/01/2023 4:53:00 PM By: Midge Aver MSN RN CNS WTA Entered By: Midge Aver on 12/31/2022 14:32:39 -------------------------------------------------------------------------------- Arrival Information Details Patient Name: Date of Service: Tina Foley. 12/31/2022 2:00 PM Medical Record Number: [id number] Patient Account Number: [id number] Date of Birth/Sex: Treating RN: March 28, 1945 (77 y.o. Tina Foley Primary Care Tina Foley: Tina Foley Other Clinician: Referring Caprisha Bridgett: Treating Karlina Suares/Extender: Tina Foley Tina Foley, Tina Foley ([id number]) [id number] Page 2 of 10 Weeks in Treatment: 0 Visit Information Patient Arrived: Wheel Chair Arrival Time: 14:27 Accompanied By: niece Transfer Assistance: None Patient Identification Verified: Yes Secondary Verification Process Completed: Yes Patient Requires Transmission-Based Precautions: No Patient Has Alerts: Yes Patient Alerts: Diabetes type 2 Electronic Signature(Foley) Signed: 01/01/2023 4:53:00 PM By: Midge Aver MSN RN CNS WTA Entered By: Midge Aver on 12/31/2022 14:29:38 -------------------------------------------------------------------------------- Clinic Level of Care Assessment  Details Patient Name: Date of Service: Tina Foley, Tina Foley 12/31/2022 2:00 PM Medical Record Number: [id number] Patient Account Number: [id number] Date of Birth/Sex: Treating RN: May 03, 1945 (77 y.o. Tina Foley Primary Care Tina Foley: Tina Foley Other Clinician: Referring Tina Foley: Treating Tina Foley/Extender: Tina Foley in Treatment: 0 Clinic Level of Care Assessment Items TOOL 2 Quantity Score X- 1 0 Use when only an EandM is performed on the INITIAL visit ASSESSMENTS - Nursing Assessment / Reassessment X- 1 20 General Physical Exam (combine w/ comprehensive assessment (listed just below) when performed on new pt. evals) X- 1 25 Comprehensive Assessment (HX, ROS, Risk Assessments, Wounds Hx, etc.) ASSESSMENTS - Wound and Skin A ssessment / Reassessment X - Simple Wound Assessment / Reassessment - one wound 1 5 []  - 0 Complex Wound Assessment / Reassessment - multiple wounds []  - 0 Dermatologic / Skin Assessment (not related to wound area) ASSESSMENTS - Ostomy and/or Continence Assessment and Care []  - 0 Incontinence Assessment and Management []  - 0 Ostomy Care Assessment and Management (repouching, etc.) PROCESS - Coordination of Care X - Simple Patient / Family Education for ongoing care 1 15 []  - 0 Complex (extensive) Patient / Family Education for ongoing care X- 1 10 Staff obtains Chiropractor, Records, T Results / Process Orders est []  - 0 Staff telephones HHA, Nursing Homes / Clarify orders / etc []  - 0 Routine Transfer to another Facility (non-emergent condition) []  - 0 Routine Hospital Admission (non-emergent condition) X- 1 15 New Admissions / Insurance Authorizations / Ordering NPWT Apligraf, etc. , []  - 0 Emergency Hospital Admission (emergent condition) Tina Foley, Tina Foley ([id number]) [id number] Page 3 of 10 X- 1 10 Simple Discharge Coordination []  - 0 Complex (extensive) Discharge  Coordination PROCESS - Special Needs []  - 0 Pediatric / Minor Patient Management []  - 0 Isolation Patient Management []  - 0 Hearing / Language / Visual special needs []  - 0 Assessment of Community assistance (transportation, D/Foley planning, etc.) []  - 0 Additional assistance / Altered mentation []  - 0 Support Surface(Foley) Assessment (bed, cushion, seat, etc.) INTERVENTIONS - Wound Cleansing / Measurement X- 1 5 Wound Imaging (photographs -  any number of wounds) []  - 0 Wound Tracing (instead of photographs) X- 1 5 Simple Wound Measurement - one wound []  - 0 Complex Wound Measurement - multiple wounds X- 1 5 Simple Wound Cleansing - one wound []  - 0 Complex Wound Cleansing - multiple wounds INTERVENTIONS - Wound Dressings X - Small Wound Dressing one or multiple wounds 1 10 []  - 0 Medium Wound Dressing one or multiple wounds []  - 0 Large Wound Dressing one or multiple wounds []  - 0 Application of Medications - injection INTERVENTIONS - Miscellaneous []  - 0 External ear exam []  - 0 Specimen Collection (cultures, biopsies, blood, body fluids, etc.) []  - 0 Specimen(Foley) / Culture(Foley) sent or taken to Lab for analysis []  - 0 Patient Transfer (multiple staff / Nurse, adult / Similar devices) []  - 0 Simple Staple / Suture removal (25 or less) []  - 0 Complex Staple / Suture removal (26 or more) []  - 0 Hypo / Hyperglycemic Management (close monitor of Blood Glucose) []  - 0 Ankle / Brachial Index (ABI) - do not check if billed separately Has the patient been seen at the hospital within the last three years: Yes Total Score: 125 Level Of Care: New/Established - Level 4 Electronic Signature(Foley) Signed: 01/01/2023 4:53:00 PM By: Midge Aver MSN RN CNS WTA Entered By: Midge Aver on 12/31/2022 17:20:09 Encounter Discharge Information Details -------------------------------------------------------------------------------- Tina Foley ([id number])  [id number] Page 4 of 10 Patient Name: Date of Service: Tina Foley 12/31/2022 2:00 PM Medical Record Number: [id number] Patient Account Number: [id number] Date of Birth/Sex: Treating RN: [birth year] (77 y.o. Tina Foley Primary Care Darren Nodal: Tina Foley Other Clinician: Referring Tina Sarver: Treating Galia Rahm/Extender: Tina Foley in Treatment: 0 Encounter Discharge Information Items Discharge Condition: Stable Ambulatory Status: Wheelchair Discharge Destination: Home Transportation: Private Auto Accompanied By: Niece Schedule Follow-up Appointment: Yes Clinical Summary of Care: Electronic Signature(Foley) Signed: 12/31/2022 5:21:25 PM By: Midge Aver MSN RN CNS WTA Entered By: Midge Aver on 12/31/2022 17:21:25 -------------------------------------------------------------------------------- Lower Extremity Assessment Details Patient Name: Date of Service: Tina Foley, Tina Foley 12/31/2022 2:00 PM Medical Record Number: [id number] Patient Account Number: [id number] Date of Birth/Sex: Treating RN: 10-07-1945 (77 y.o. Tina Foley Primary Care Kacelyn Rowzee: Tina Foley Other Clinician: Referring Symphoni Helbling: Treating Aide Wojnar/Extender: Tina Foley in Treatment: 0 Electronic Signature(Foley) Signed: 01/01/2023 4:53:00 PM By: Midge Aver MSN RN CNS WTA Entered By: Midge Aver on 12/31/2022 14:49:51 -------------------------------------------------------------------------------- Multi Wound Chart Details Patient Name: Date of Service: Tina Foley. 12/31/2022 2:00 PM Medical Record Number: [id number] Patient Account Number: [id number] Date of Birth/Sex: Treating RN: 1945-03-22 (77 y.o. Tina Foley Primary Care Tye Vigo: Tina Foley Other Clinician: Referring Ajamu Maxon: Treating Jedrick Hutcherson/Extender: Tina Foley in Treatment: 0 Vital  Signs Height(in): Pulse(bpm): 84 Weight(lbs): Blood Pressure(mmHg): 141/73 Body Mass Index(BMI): Tina Foley, Tina Foley ([id number]) [phone].pdf Page 5 of 10 Temperature(F): 97.5 Respiratory Rate(breaths/min): 18 [1:Photos:] [N/A:N/A] Midline Gluteus N/A N/A Wound Location: Gradually Appeared N/A N/A Wounding Event: Pressure Ulcer N/A N/A Primary Etiology: Asthma, Congestive Heart Failure, N/A N/A Comorbid History: Hypertension, Type II Diabetes, Neuropathy, Received Chemotherapy, Received Radiation 12/30/2021 N/A N/A Date Acquired: 0 N/A N/A Weeks of Treatment: Open N/A N/A Wound Status: No N/A N/A Wound Recurrence: 0.2x0.2x3.5 N/A N/A Measurements L x W x D (cm) 0.031 N/A N/A A (cm) : rea 0.11 N/A N/A Volume (cm) : Category/Stage IV N/A N/A Classification: Large N/A N/A Exudate A mount: Serous N/A N/A Exudate Type: amber N/A N/A Exudate  Color: Medium (34-66%) N/A N/A Granulation A mount: Red N/A N/A Granulation Quality: None Present (0%) N/A N/A Necrotic A mount: Fat Layer (Subcutaneous Tissue): Yes N/A N/A Exposed Structures: Fascia: No Tendon: No Muscle: No Joint: No Bone: No None N/A N/A Epithelialization: Treatment Notes Electronic Signature(Foley) Signed: 01/01/2023 4:53:00 PM By: Midge Aver MSN RN CNS WTA Entered By: Midge Aver on 12/31/2022 15:20:40 -------------------------------------------------------------------------------- Multi-Disciplinary Care Plan Details Patient Name: Date of Service: Tina Foley. 12/31/2022 2:00 PM Medical Record Number: [id number] Patient Account Number: [id number] Date of Birth/Sex: Treating RN: 1945-12-28 (77 y.o. [place], [place], [place] Foley ([id number]) [id number] Page 6 of 10 Primary Care Infant Doane: Tina Foley Other Clinician: Referring Caily Rakers: Treating Jaret Coppedge/Extender: Tina Foley in Treatment: 0 Active  Inactive Orientation to the Wound Care Program Nursing Diagnoses: Knowledge deficit related to the wound healing center program Goals: Patient/caregiver will verbalize understanding of the Wound Healing Center Program Date Initiated: 12/31/2022 Target Resolution Date: 01/07/2023 Goal Status: Active Interventions: Provide education on orientation to the wound center Notes: Pressure Nursing Diagnoses: Knowledge deficit related to causes and risk factors for pressure ulcer development Knowledge deficit related to management of pressures ulcers Potential for impaired tissue integrity related to pressure, friction, moisture, and shear Goals: Patient will remain free from development of additional pressure ulcers Date Initiated: 12/31/2022 Target Resolution Date: 01/30/2023 Goal Status: Active Patient will remain free of pressure ulcers Date Initiated: 12/31/2022 Target Resolution Date: 01/30/2023 Goal Status: Active Patient/caregiver will verbalize risk factors for pressure ulcer development Date Initiated: 12/31/2022 Target Resolution Date: 01/16/2023 Goal Status: Active Patient/caregiver will verbalize understanding of pressure ulcer management Date Initiated: 12/31/2022 Target Resolution Date: 01/30/2023 Goal Status: Active Interventions: Assess: immobility, friction, shearing, incontinence upon admission and as needed Assess offloading mechanisms upon admission and as needed Assess potential for pressure ulcer upon admission and as needed Provide education on pressure ulcers Notes: Wound/Skin Impairment Nursing Diagnoses: Impaired tissue integrity Knowledge deficit related to ulceration/compromised skin integrity Goals: Patient/caregiver will verbalize understanding of skin care regimen Date Initiated: 12/31/2022 Target Resolution Date: 01/30/2023 Goal Status: Active Ulcer/skin breakdown will have a volume reduction of 30% by week 4 Date Initiated: 12/31/2022 Target  Resolution Date: 01/30/2023 Goal Status: Active Ulcer/skin breakdown will have a volume reduction of 50% by week 8 Date Initiated: 12/31/2022 Target Resolution Date: 03/02/2023 Goal Status: Active Ulcer/skin breakdown will have a volume reduction of 80% by week 12 Date Initiated: 12/31/2022 Target Resolution Date: 04/02/2023 Goal Status: Active Ulcer/skin breakdown will heal within 14 weeks Date Initiated: 12/31/2022 Target Resolution Date: 04/16/2023 Goal Status: Active Interventions: Assess patient/caregiver ability to obtain necessary supplies Tina Foley, Tina Foley ([id number]) [phone].pdf Page 7 of 10 Assess patient/caregiver ability to perform ulcer/skin care regimen upon admission and as needed Assess ulceration(Foley) every visit Provide education on ulcer and skin care Treatment Activities: Patient referred to home care : 12/31/2022 Skin care regimen initiated : 12/31/2022 Notes: Electronic Signature(Foley) Signed: 12/31/2022 5:20:24 PM By: Midge Aver MSN RN CNS WTA Entered By: Midge Aver on 12/31/2022 17:20:24 -------------------------------------------------------------------------------- Pain Assessment Details Patient Name: Date of Service: Tina Foley. 12/31/2022 2:00 PM Medical Record Number: [id number] Patient Account Number: [id number] Date of Birth/Sex: Treating RN: 03/16/1945 (77 y.o. Tina Foley Primary Care Elva Breaker: Tina Foley Other Clinician: Referring Kecia Swoboda: Treating Moksh Loomer/Extender: Tina Foley in Treatment: 0 Active Problems Location of Pain Severity and Description of Pain Patient Has Paino Yes Site Locations Rate the pain. Current Pain Level: 6 Pain Management and  Medication Current Pain Management: Electronic Signature(Foley) Signed: 01/01/2023 4:53:00 PM By: Midge Aver MSN RN CNS WTA Entered By: Midge Aver on 12/31/2022 14:29:57 Tina Foley, Tina Foley ([id number])  [id number] Page 8 of 10 -------------------------------------------------------------------------------- Patient/Caregiver Education Details Patient Name: Date of Service: Tina Foley, Tina Foley 11/26/2024andnbsp2:00 PM Medical Record Number: [id number] Patient Account Number: [id number] Date of Birth/Gender: Treating RN: [birth year] (77 y.o. Tina Foley Primary Care Physician: Tina Foley Other Clinician: Referring Physician: Treating Physician/Extender: Tina Foley in Treatment: 0 Education Assessment Education Provided To: Patient Education Topics Provided Welcome T [facility] Packet: o Handouts: Welcome T [facility] o Methods: Explain/Verbal Responses: State content correctly Wound/Skin Impairment: Handouts: Caring for Your Ulcer Methods: Explain/Verbal Responses: State content correctly Electronic Signature(Foley) Signed: 01/01/2023 4:53:00 PM By: Midge Aver MSN RN CNS WTA Entered By: Midge Aver on 12/31/2022 17:20:28 -------------------------------------------------------------------------------- Wound Assessment Details Patient Name: Date of Service: Tina Foley. 12/31/2022 2:00 PM Medical Record Number: [id number] Patient Account Number: [id number] Date of Birth/Sex: Treating RN: Jun 11, 1945 (77 y.o. Tina Foley Primary Care Lucianne Smestad: Tina Foley Other Clinician: Referring Myisha Pickerel: Treating Everline Mahaffy/Extender: Tina Foley in Treatment: 0 Wound Status Wound Number: 1 Primary Pressure Ulcer Etiology: Wound Location: Midline Gluteus Wound Open Wounding Event: Gradually Appeared Status: Date Acquired: 12/30/2021 Comorbid Asthma, Congestive Heart Failure, Hypertension, Type II Diabetes, Weeks Of Treatment: 0 History: Neuropathy, Received Chemotherapy, Received Radiation Clustered Wound: No Tina Foley, Tina Foley ([id number])  [id number] Page 9 of 10 Photos Wound Measurements Length: (cm) 0.2 Width: (cm) 0.2 Depth: (cm) 3.5 Area: (cm) 0.031 Volume: (cm) 0.11 % Reduction in Area: % Reduction in Volume: Epithelialization: None Wound Description Classification: Category/Stage IV Exudate Amount: Large Exudate Type: Serous Exudate Color: amber Foul Odor After Cleansing: No Slough/Fibrino No Wound Bed Granulation Amount: Medium (34-66%) Exposed Structure Granulation Quality: Red Fascia Exposed: No Necrotic Amount: None Present (0%) Fat Layer (Subcutaneous Tissue) Exposed: Yes Tendon Exposed: No Muscle Exposed: No Joint Exposed: No Bone Exposed: No Treatment Notes Wound #1 (Gluteus) Wound Laterality: Midline Cleanser Soap and Water Discharge Instruction: Gently cleanse wound with antibacterial soap, rinse and pat dry prior to dressing wounds Vashe 5.8 (oz) Discharge Instruction: Use vashe 5.8 (oz) as directed Peri-Wound Care Topical Primary Dressing Hydrofera blue rope Secondary Dressing ABD Pad 5x9 (in/in) Discharge Instruction: Cover with ABD pad Secured With Paper Tape, 1x 1.5 (in/yd) Compression Wrap Compression Stockings Add-Ons Electronic Signature(Foley) Signed: 01/01/2023 4:53:00 PM By: Midge Aver MSN RN CNS WTA Entered By: Midge Aver on 12/31/2022 14:49:35 Tina Foley, Tina Foley ([id number]) [id number] Page 10 of 10 -------------------------------------------------------------------------------- Vitals Details Patient Name: Date of Service: Tina Foley, Tina Foley 12/31/2022 2:00 PM Medical Record Number: [id number] Patient Account Number: [id number] Date of Birth/Sex: Treating RN: 1945/11/11 (77 y.o. Tina Foley Primary Care Corry Storie: Tina Foley Other Clinician: Referring Chelsey Redondo: Treating Ameilia Rattan/Extender: Tina Foley in Treatment: 0 Vital Signs Time Taken: 14:30 Temperature (F): 97.5 Pulse  (bpm): 84 Respiratory Rate (breaths/min): 18 Blood Pressure (mmHg): 141/73 Reference Range: 80 - 120 mg / dl Electronic Signature(Foley) Signed: 01/01/2023 4:53:00 PM By: Midge Aver MSN RN CNS WTA Entered By: Midge Aver on 12/31/2022 14:30:31

## 2023-01-02 NOTE — Progress Notes (Signed)
TERESIA, PIETTE ([id number]) [id number] Page 1 of 5 Visit Report for 12/31/2022 Abuse Risk Screen Details Patient Name: Date of Service: Tina Foley, Tina Foley 12/31/2022 2:00 PM Medical Record Number: [id number] Patient Account Number: [id number] Date of Birth/Sex: Treating RN: Dec 01, 1945 (77 y.o. Ginette Pitman Primary Care Matisyn Cabeza: Cleon Dew Other Clinician: Referring Novie Maggio: Treating Rajendra Spiller/Extender: Corliss Skains in Treatment: 0 Abuse Risk Screen Items Answer Electronic Signature(s) Signed: 01/01/2023 4:53:00 PM By: Midge Aver MSN RN CNS WTA Entered By: Midge Aver on 12/31/2022 14:37:04 -------------------------------------------------------------------------------- Activities of Daily Living Details Patient Name: Date of Service: Tina Foley, Tina Foley 12/31/2022 2:00 PM Medical Record Number: [id number] Patient Account Number: [id number] Date of Birth/Sex: Treating RN: 10-19-1945 (77 y.o. Ginette Pitman Primary Care Skylor Hughson: Cleon Dew Other Clinician: Referring Natalin Bible: Treating Stefan Karen/Extender: Corliss Skains in Treatment: 0 Activities of Daily Living Items Answer Activities of Daily Living (Please select one for each item) Drive Automobile Not Able T Medications ake Need Assistance Use T elephone Need Assistance Care for Appearance Need Assistance Use T oilet Need Assistance Bath / Shower Need Assistance Dress Self Need Assistance Feed Self Completely Able Walk Need Assistance Get In / Out Bed Need Assistance Housework Not Able Prepare Meals Not Able Handle Money Not Able Shop for Self Not KENYON, MENGES ([id number]) [id number] Page 2 of 5 Electronic Signature(s) Signed: 01/01/2023 4:53:00 PM By: Midge Aver MSN RN CNS WTA Entered By: Midge Aver on 12/31/2022  14:37:45 -------------------------------------------------------------------------------- Education Screening Details Patient Name: Date of Service: Tina Chase. 12/31/2022 2:00 PM Medical Record Number: [id number] Patient Account Number: [id number] Date of Birth/Sex: Treating RN: 02/13/1945 (77 y.o. Ginette Pitman Primary Care Travious Vanover: Cleon Dew Other Clinician: Referring Encarnacion Scioneaux: Treating Orrie Lascano/Extender: Corliss Skains in Treatment: 0 Learning Preferences/Education Level/Primary Language Learning Preference: Explanation, Demonstration Preferred Language: English Cognitive Barrier Language Barrier: No Translator Needed: No Memory Deficit: No Emotional Barrier: No Cultural/Religious Beliefs Affecting Medical Care: No Physical Barrier Impaired Vision: No Impaired Hearing: No Decreased Hand dexterity: No Knowledge/Comprehension Knowledge Level: High Comprehension Level: High Ability to understand written instructions: High Ability to understand verbal instructions: High Motivation Anxiety Level: Calm Cooperation: Cooperative Education Importance: Acknowledges Need Interest in Health Problems: Asks Questions Perception: Coherent Willingness to Engage in Self-Management High Activities: Readiness to Engage in Self-Management High Activities: Electronic Signature(s) Signed: 01/01/2023 4:53:00 PM By: Midge Aver MSN RN CNS WTA Entered By: Midge Aver on 12/31/2022 14:38:08 Treichler, Cheral Bay ([id number]) [id number] Page 3 of 5 -------------------------------------------------------------------------------- Fall Risk Assessment Details Patient Name: Date of Service: Tina Foley, Tina Foley 12/31/2022 2:00 PM Medical Record Number: [id number] Patient Account Number: [id number] Date of Birth/Sex: Treating RN: October 18, 1945 (77 y.o. Ginette Pitman Primary Care Sokhna Christoph: Cleon Dew Other  Clinician: Referring Hazem Kenner: Treating Loron Weimer/Extender: Corliss Skains in Treatment: 0 Fall Risk Assessment Items Have you had 2 or more falls in the last 12 monthso 0 No Have you had any fall that resulted in injury in the last 12 monthso 0 No FALLS RISK SCREEN History of falling - immediate or within 3 months 0 No Secondary diagnosis (Do you have 2 or more medical diagnoseso) 0 No Ambulatory aid None/bed rest/wheelchair/nurse 0 Yes Crutches/cane/walker 0 No Furniture 0 No Intravenous therapy Access/Saline/Heparin Lock 0 No Gait/Transferring Normal/ bed rest/ wheelchair 0 No Weak (short steps with or without shuffle, stooped but able to lift head while walking, may seek 10 Yes support from furniture)  Impaired (short steps with shuffle, may have difficulty arising from chair, head down, impaired 0 No balance) Mental Status Oriented to own ability 0 Yes Electronic Signature(s) Signed: 01/01/2023 4:53:00 PM By: Midge Aver MSN RN CNS WTA Entered By: Midge Aver on 12/31/2022 14:38:42 -------------------------------------------------------------------------------- Foot Assessment Details Patient Name: Date of Service: Tina Chase. 12/31/2022 2:00 PM Medical Record Number: [id number] Patient Account Number: [id number] Date of Birth/Sex: Treating RN: Jan 03, 1946 (77 y.o. Ginette Pitman Primary Care Heidy Mccubbin: Cleon Dew Other Clinician: Referring Nasim Habeeb: Treating Sophiamarie Nease/Extender: Corliss Skains in Treatment: 0 Foot Assessment Items Site Locations [place], [place] ([id number]) [id number] Page 4 of 5 + = Sensation present, - = Sensation absent, C = Callus, U = Ulcer R = Redness, W = Warmth, M = Maceration, PU = Pre-ulcerative lesion F = Fissure, S = Swelling, D = Dryness Assessment Right: Left: Other Deformity: No No Prior Foot Ulcer: No No Prior Amputation: No No Charcot  Joint: No No Ambulatory Status: Ambulatory With Help Assistance Device: Wheelchair Gait: Surveyor, mining) Signed: 01/01/2023 4:53:00 PM By: Midge Aver MSN RN CNS WTA Entered By: Midge Aver on 12/31/2022 14:40:27 -------------------------------------------------------------------------------- Nutrition Risk Screening Details Patient Name: Date of Service: Tina Foley, Tina Foley 12/31/2022 2:00 PM Medical Record Number: [id number] Patient Account Number: [id number] Date of Birth/Sex: Treating RN: October 22, 1945 (77 y.o. Ginette Pitman Primary Care Rukia Mcgillivray: Cleon Dew Other Clinician: Referring Sherman Lipuma: Treating Rhonna Holster/Extender: Corliss Skains in Treatment: 0 Height (in): Weight (lbs): Body Mass Index (BMI): Nutrition Risk Screening Items Score Screening NUTRITION RISK SCREEN: I have an illness or condition that made me change the kind and/or amount of food I eat 0 No I eat fewer than two meals per day 3 Yes I eat few fruits and vegetables, or milk products 0 No I have three or more drinks of beer, liquor or wine almost every day 0 No I have tooth or mouth problems that make it hard for me to eat 0 No Tina Foley, Tina Foley C ([id number]) [id number] Page 5 of 5 I don't always have enough money to buy the food I need 0 No I eat alone most of the time 0 No I take three or more different prescribed or over-the-counter drugs a day 1 Yes Without wanting to, I have lost or gained 10 pounds in the last six months 0 No I am not always physically able to shop, cook and/or feed myself 0 No Nutrition Protocols Good Risk Protocol Moderate Risk Protocol 0 Provide education on nutrition High Risk Proctocol Risk Level: Moderate Risk Score: 4 Electronic Signature(s) Signed: 01/01/2023 4:53:00 PM By: Midge Aver MSN RN CNS WTA Entered By: Midge Aver on 12/31/2022 14:40:12

## 2023-01-14 ENCOUNTER — Encounter: Payer: Medicare Other | Attending: Physician Assistant | Admitting: Physician Assistant

## 2023-01-14 DIAGNOSIS — M4628 Osteomyelitis of vertebra, sacral and sacrococcygeal region: Secondary | ICD-10-CM | POA: Insufficient documentation

## 2023-01-14 DIAGNOSIS — B354 Tinea corporis: Secondary | ICD-10-CM | POA: Diagnosis not present

## 2023-01-14 DIAGNOSIS — E11622 Type 2 diabetes mellitus with other skin ulcer: Secondary | ICD-10-CM | POA: Insufficient documentation

## 2023-01-14 DIAGNOSIS — I110 Hypertensive heart disease with heart failure: Secondary | ICD-10-CM | POA: Insufficient documentation

## 2023-01-14 DIAGNOSIS — I5042 Chronic combined systolic (congestive) and diastolic (congestive) heart failure: Secondary | ICD-10-CM | POA: Diagnosis not present

## 2023-01-14 DIAGNOSIS — L89154 Pressure ulcer of sacral region, stage 4: Secondary | ICD-10-CM | POA: Diagnosis not present

## 2023-01-14 NOTE — Progress Notes (Signed)
GOLDEN, HAVARD ([id number]) [id number] Page 1 of 6 Visit Report for 01/14/2023 Chief Complaint Document Details Patient Name: Date of Service: Tina Foley, Tina Foley 01/14/2023 2:00 PM Medical Record Number: [id number] Patient Account Number: [id number] Date of Birth/Sex: Treating RN: 04-30-1945 (77 y.o. Esmeralda Links Primary Care Provider: Cleon Dew Other Clinician: Referring Provider: Treating Provider/Extender: Corliss Skains in Treatment: 2 Information Obtained from: Patient Chief Complaint Midline sacral ulcer Electronic Signature(s) Signed: 01/14/2023 1:55:49 PM By: Allen Derry PA-Foley Entered By: Allen Derry on 01/14/2023 13:55:48 -------------------------------------------------------------------------------- HPI Details Patient Name: Date of Service: Tina Chase. 01/14/2023 2:00 PM Medical Record Number: [id number] Patient Account Number: [id number] Date of Birth/Sex: Treating RN: 1945/09/25 (77 y.o. Esmeralda Links Primary Care Provider: Cleon Dew Other Clinician: Referring Provider: Treating Provider/Extender: Corliss Skains in Treatment: 2 History of Present Illness HPI Description: 12-31-2022 upon evaluation today patient presents for initial inspection here in our clinic concerning a wound that she has over the sacral area with the bilateral buttocks actually being involved with some irritation that spreads quite expensively around this area. With that being said I do believe that the wound itself is unfortunately very tiny at the opening but has a much larger presents internal. This especially goes down into the 6:00 location where that seems to be the deepest part at the moment. This widespread area of erythema seems to be yeast related to me external and I believe that she would benefit from an oral antifungal at this point. This wound has been present for about a year.  It was noted in September 2024 when she had a CT that she did have the below findings: CT Findings 10/12/22: IMPRESSION: Limited visualization due to motion and patient unable to tolerate additional sequences. Sacral decubitus ulcer extending to the bone with fluid collection and gas and osteomyelitis of the adjacent coccyx. Unfortunately I am unsure as to how effectively the osteomyelitis was treated or if it is still remaining to some degree. She was in the hospital from October 11, 2022 through October 23, 2022 I just cannot find records of how long she may have had antibiotic treatment following. There is also some evidence of a recent urine culture just this month on the 11th I think it was reported out which showed a fairly resistant organism but I am unsure what to be done in that regard it did not look like there were any oral antibiotic options so it was something that would be more IV antibiotics and not even certain that that has been done I tried to talk to the patient and her niece about this but it seems like there is some confusion about what exactly has been going on there. PROMISE, CREA ([id number]) [id number] Page 2 of 6 Patient does have a history of diabetes mellitus type 2, hypertension, and congestive heart failure. With that being said the diabetes most recent hemoglobin A1c was 6.0 which is excellent and that was actually the August 07, 2022 I am very pleased in that regard. 01-14-2023 upon evaluation today patient appears to be doing about the same currently regard to her wound. Unfortunately she still has a deep wound minding all of this area which is quite unfortunate. She unfortunately is still not getting off of her gluteal region is much as I would like to see she needs to be offloading much more effectively and frequently in my opinion. Electronic Signature(s) Signed: 01/15/2023 7:01:03 PM By: Allen Derry PA-Foley  Entered By: Allen Derry on  01/15/2023 19:01:03 -------------------------------------------------------------------------------- Physical Exam Details Patient Name: Date of Service: Tina Foley, Tina Foley 01/14/2023 2:00 PM Medical Record Number: [id number] Patient Account Number: [id number] Date of Birth/Sex: Treating RN: 1945/02/19 (77 y.o. Esmeralda Links Primary Care Provider: Cleon Dew Other Clinician: Referring Provider: Treating Provider/Extender: Corliss Skains in Treatment: 2 Constitutional Well-nourished and well-hydrated in no acute distress. Respiratory normal breathing without difficulty. Psychiatric this patient is able to make decisions and demonstrates good insight into disease process. Alert and Oriented x 3. pleasant and cooperative. Notes Upon inspection patient's wound still has significant undermining at this point. Unfortunately I do not see much improvement compared to last time I saw her. Electronic Signature(s) Signed: 01/15/2023 7:01:30 PM By: Allen Derry PA-Foley Entered By: Allen Derry on 01/15/2023 19:01:30 -------------------------------------------------------------------------------- Physician Orders Details Patient Name: Date of Service: Tina Foley, Tina Foley 01/14/2023 2:00 PM Medical Record Number: [id number] Patient Account Number: [id number] Date of Birth/Sex: Treating RN: May 06, 1945 (77 y.o. Esmeralda Links Primary Care Provider: Cleon Dew Other Clinician: Referring Provider: Treating Provider/Extender: Corliss Skains in Treatment: 2 The following information was scribed by: Angelina Pih The information was scribed for: Teylor, Gerth ([id number]) [id number] Page 3 of 6 Verbal / Phone Orders: No Diagnosis Coding ICD-10 Coding Code Description L89.154 Pressure ulcer of sacral region, stage 4 M46.28 Osteomyelitis of vertebra, sacral and sacrococcygeal region B35.4  Tinea corporis E11.622 Type 2 diabetes mellitus with other skin ulcer I10 Essential (primary) hypertension I50.42 Chronic combined systolic (congestive) and diastolic (congestive) heart failure Follow-up Appointments Return Appointment in 2 weeks. [facility] T [phone] Victorino Dike [phone] Fax [phone] om CONTINUE Home Health for wound care. May utilize formulary equivalent dressing for wound treatment orders unless otherwise specified. Home Health Nurse may visit PRN to address patients wound care needs. - Hydrofera blue rope packed into the wound Bathing/ Shower/ Hygiene Wash wounds with antibacterial soap and water. Wound Treatment Wound #1 - Gluteus Wound Laterality: Midline Cleanser: Soap and Water 1 x Per Day/15 Days Discharge Instructions: Gently cleanse wound with antibacterial soap, rinse and pat dry prior to dressing wounds Cleanser: Vashe 5.8 (oz) 1 x Per Day/15 Days Discharge Instructions: Use vashe 5.8 (oz) as directed Prim Dressing: Hydrofera blue rope (Home Health) 1 x Per Day/15 Days ary Discharge Instructions: cut in half Secondary Dressing: ABD Pad 5x9 (in/in) 1 x Per Day/15 Days Discharge Instructions: Cover with ABD pad Secured With: Paper Tape, 1x 1.5 (in/yd) 1 x Per Day/15 Days Electronic Signature(s) Signed: 01/14/2023 4:48:05 PM By: Angelina Pih Signed: 01/15/2023 7:54:12 PM By: Allen Derry PA-Foley Entered By: Angelina Pih on 01/14/2023 15:33:39 -------------------------------------------------------------------------------- Problem List Details Patient Name: Date of Service: Tina Chase. 01/14/2023 2:00 PM Medical Record Number: [id number] Patient Account Number: [id number] Date of Birth/Sex: Treating RN: 1945/12/05 (77 y.o. Esmeralda Links Primary Care Provider: Cleon Dew Other Clinician: Referring Provider: Treating Provider/Extender: Corliss Skains in Treatment:  2 Active Problems ICD-10 Encounter SALLY, DECENA ([id number]) [id number] Page 4 of 6 Encounter Code Description Active Date MDM Diagnosis L89.154 Pressure ulcer of sacral region, stage 4 12/31/2022 No Yes M46.28 Osteomyelitis of vertebra, sacral and sacrococcygeal region 12/31/2022 No Yes B35.4 Tinea corporis 12/31/2022 No Yes E11.622 Type 2 diabetes mellitus with other skin ulcer 12/31/2022 No Yes I10 Essential (primary) hypertension 12/31/2022 No Yes I50.42 Chronic combined systolic (congestive) and diastolic (congestive) heart failure  12/31/2022 No Yes Inactive Problems Resolved Problems Electronic Signature(s) Signed: 01/14/2023 1:55:45 PM By: Allen Derry PA-Foley Entered By: Allen Derry on 01/14/2023 13:55:45 -------------------------------------------------------------------------------- Progress Note Details Patient Name: Date of Service: Tina Foley, Tina Foley 01/14/2023 2:00 PM Medical Record Number: [id number] Patient Account Number: [id number] Date of Birth/Sex: Treating RN: [birth year] (77 y.o. Esmeralda Links Primary Care Provider: Cleon Dew Other Clinician: Referring Provider: Treating Provider/Extender: Corliss Skains in Treatment: 2 Subjective Chief Complaint Information obtained from Patient Midline sacral ulcer History of Present Illness (HPI) 12-31-2022 upon evaluation today patient presents for initial inspection here in our clinic concerning a wound that she has over the sacral area with the bilateral buttocks actually being involved with some irritation that spreads quite expensively around this area. With that being said I do believe that the wound itself is unfortunately very tiny at the opening but has a much larger presents internal. This especially goes down into the 6:00 location where that seems to be the deepest part at the moment. This widespread area of erythema seems to be yeast related to me  external and I believe that she would benefit from an oral antifungal at this point. This wound has been present for about a year. It was noted in September 2024 when she had a CT that she did have the below findings: CT Findings 10/12/22: IMPRESSION: Limited visualization due to motion and patient unable to tolerate additional sequences. Sacral decubitus ulcer extending to the bone with fluid collection and gas and osteomyelitis of the adjacent coccyx. Unfortunately I am unsure as to how effectively the osteomyelitis was treated or if it is still remaining to some degree. She was in the hospital from [place] ([id number]) [id number] Page 5 of 6 October 11, 2022 through October 23, 2022 I just cannot find records of how long she may have had antibiotic treatment following. There is also some evidence of a recent urine culture just this month on the 11th I think it was reported out which showed a fairly resistant organism but I am unsure what to be done in that regard it did not look like there were any oral antibiotic options so it was something that would be more IV antibiotics and not even certain that that has been done I tried to talk to the patient and her niece about this but it seems like there is some confusion about what exactly has been going on there. Patient does have a history of diabetes mellitus type 2, hypertension, and congestive heart failure. With that being said the diabetes most recent hemoglobin A1c was 6.0 which is excellent and that was actually the August 07, 2022 I am very pleased in that regard. 01-14-2023 upon evaluation today patient appears to be doing about the same currently regard to her wound. Unfortunately she still has a deep wound minding all of this area which is quite unfortunate. She unfortunately is still not getting off of her gluteal region is much as I would like to see she needs to be offloading much more effectively and  frequently in my opinion. Objective Constitutional Well-nourished and well-hydrated in no acute distress. Vitals Time Taken: 2:15 PM, Temperature: 97.4 F, Pulse: 88 bpm, Respiratory Rate: 18 breaths/min, Blood Pressure: 125/78 mmHg. Respiratory normal breathing without difficulty. Psychiatric this patient is able to make decisions and demonstrates good insight into disease process. Alert and Oriented x 3. pleasant and cooperative. General Notes: Upon inspection patient's wound still has significant undermining at this  point. Unfortunately I do not see much improvement compared to last time I saw her. Integumentary (Hair, Skin) Wound #1 status is Open. Original cause of wound was Gradually Appeared. The date acquired was: 12/30/2021. The wound has been in treatment 2 weeks. The wound is located on the Midline Gluteus. The wound measures 0.2cm length x 0.5cm width x 2.1cm depth; 0.079cm^2 area and 0.165cm^3 volume. There is Fat Layer (Subcutaneous Tissue) exposed. There is no tunneling noted, however, there is undermining starting at 12:00 and ending at 12:00 with a maximum distance of 3.9cm. There is a large amount of serosanguineous drainage noted. There is large (67-100%) red granulation within the wound bed. There is no necrotic tissue within the wound bed. Assessment Active Problems ICD-10 Pressure ulcer of sacral region, stage 4 Osteomyelitis of vertebra, sacral and sacrococcygeal region Tinea corporis Type 2 diabetes mellitus with other skin ulcer Essential (primary) hypertension Chronic combined systolic (congestive) and diastolic (congestive) heart failure Plan Follow-up Appointments: Return Appointment in 2 weeks. [facility] Health: [facility] T [phone] Victorino Dike [phone] Fax [phone] om [facility]INUE Home Health for wound care. May utilize formulary equivalent dressing for wound treatment orders unless otherwise specified. Home Health Nurse may  visit PRN to address patients wound care needs. - Hydrofera blue rope packed into the wound Bathing/ Shower/ Hygiene: Wash wounds with antibacterial soap and water. WOUND #1: - Gluteus Wound Laterality: Midline Cleanser: Soap and Water 1 x Per Day/15 Days Discharge Instructions: Gently cleanse wound with antibacterial soap, rinse and pat dry prior to dressing wounds Cleanser: Vashe 5.8 (oz) 1 x Per Day/15 Days Discharge Instructions: Use vashe 5.8 (oz) as directed Prim Dressing: Hydrofera blue rope (Home Health) 1 x Per Day/15 Days ary Discharge Instructions: cut in half Secondary Dressing: ABD Pad 5x9 (in/in) 1 x Per Day/15 Days Discharge Instructions: Cover with ABD pad Secured With: Paper Tape, 1x 1.5 (in/yd) 1 x Per Day/15 Days Tina Foley, Tina Foley ([id number]) [id number] Page 6 of 6 1. At this point we are attempting to pack with a Hydrofera Blue rope although I think that this is a significant wound that I am not certain the Hydrofera Blue rope is going to take care of. I discussed this with the patient and her niece today. 2. I am going to recommend as well that the patient should continue with follow-ups with infectious disease and she has this already being worked on as far as the previous provider is concerned. This is not a new referral that we need to make. 3. I would recommend that she needs to continue to offload is much as possible the morning she can do the better in my opinion. We will see patient back for reevaluation in 2 weeks here in the clinic. If anything worsens or changes patient will contact our office for additional recommendations. Electronic Signature(s) Signed: 01/15/2023 7:02:26 PM By: Allen Derry PA-Foley Entered By: Allen Derry on 01/15/2023 19:02:26 -------------------------------------------------------------------------------- SuperBill Details Patient Name: Date of Service: SUNDEE, Tina Foley 01/14/2023 Medical Record Number:  [id number] Patient Account Number: [id number] Date of Birth/Sex: Treating RN: 10-02-1945 (77 y.o. Esmeralda Links Primary Care Provider: Cleon Dew Other Clinician: Referring Provider: Treating Provider/Extender: Corliss Skains in Treatment: 2 Diagnosis Coding ICD-10 Codes Code Description [phone] Pressure ulcer of sacral region, stage 4 M46.28 Osteomyelitis of vertebra, sacral and sacrococcygeal region B35.4 Tinea corporis E11.622 Type 2 diabetes mellitus with other skin ulcer I10 Essential (primary) hypertension I50.42 Chronic combined systolic (  congestive) and diastolic (congestive) heart failure Facility Procedures : CPT4 Code: [id number] Description: 99213 - WOUND CARE VISIT-LEV 3 EST PT Modifier: Quantity: 1 Physician Procedures : CPT4 Code Description Modifier [id number] 99213 - WC PHYS LEVEL 3 - EST PT ICD-10 Diagnosis Description L89.154 Pressure ulcer of sacral region, stage 4 M46.28 Osteomyelitis of vertebra, sacral and sacrococcygeal region B35.4 Tinea corporis E11.622 Type 2  diabetes mellitus with other skin ulcer Quantity: 1 Electronic Signature(s) Signed: 01/15/2023 7:02:42 PM By: Allen Derry PA-Foley Previous Signature: 01/14/2023 3:34:05 PM Version By: Angelina Pih Entered By: Allen Derry on 01/15/2023 19:02:42

## 2023-01-14 NOTE — Progress Notes (Signed)
REMINGTON, PIEKOS ([id number]) [id number] Page 1 of 5 Visit Report for 01/14/2023 Arrival Information Details Patient Name: Date of Service: Tina Foley, Tina Foley 01/14/2023 2:00 PM Medical Record Number: [id number] Patient Account Number: [id number] Date of Birth/Sex: Treating RN: 05-25-1945 (76 y.o. Esmeralda Links Primary Care Lether Tesch: Cleon Dew Other Clinician: Referring Solveig Fangman: Treating Parmvir Boomer/Extender: Corliss Skains in Treatment: 2 Visit Information History Since Last Visit Added or deleted any medications: Yes Patient Arrived: Wheel Chair Any new allergies or adverse reactions: No Arrival Time: 14:15 Had a fall or experienced change in No Accompanied By: family activities of daily living that may affect Transfer Assistance: None risk of falls: Patient Identification Verified: Yes Hospitalized since last visit: Yes Secondary Verification Process Completed: Yes Pain Present Now: Yes Patient Requires Transmission-Based Precautions: No Patient Has Alerts: Yes Patient Alerts: Diabetes type 2 Electronic Signature(s) Unsigned Entered By: Angelina Pih on 01/14/2023 14:16:18 -------------------------------------------------------------------------------- Lower Extremity Assessment Details Patient Name: Date of Service: Tina Foley, Tina Foley 01/14/2023 2:00 PM Medical Record Number: [id number] Patient Account Number: [id number] Date of Birth/Sex: Treating RN: 1945/12/05 (77 y.o. Esmeralda Links Primary Care Samanvi Cuccia: Cleon Dew Other Clinician: Referring Jarely Juncaj: Treating Jazzmyne Rasnick/Extender: Corliss Skains in Treatment: 2 Electronic Signature(s) Unsigned Entered By: Angelina Pih on 01/14/2023 14:24:52 Signature(s): KORTNEY, CAVELL ([id number]) [id number] Date(s): [id number] Page 2 of  5 -------------------------------------------------------------------------------- Multi Wound Chart Details Patient Name: Date of Service: Tina Foley, Tina Foley 01/14/2023 2:00 PM Medical Record Number: [id number] Patient Account Number: [id number] Date of Birth/Sex: Treating RN: [birth year] (77 y.o. Esmeralda Links Primary Care Korey Prashad: Cleon Dew Other Clinician: Referring Josselyn Harkins: Treating Shakerria Parran/Extender: Corliss Skains in Treatment: 2 Vital Signs Height(in): Pulse(bpm): 88 Weight(lbs): Blood Pressure(mmHg): 125/78 Body Mass Index(BMI): Temperature(F): 97.4 Respiratory Rate(breaths/min): 18 [1:Photos:] [N/A:N/A] Midline Gluteus N/A N/A Wound Location: Gradually Appeared N/A N/A Wounding Event: Pressure Ulcer N/A N/A Primary Etiology: Asthma, Congestive Heart Failure, N/A N/A Comorbid History: Hypertension, Type II Diabetes, Neuropathy, Received Chemotherapy, Received Radiation 12/30/2021 N/A N/A Date Acquired: 2 N/A N/A Weeks of Treatment: Open N/A N/A Wound Status: No N/A N/A Wound Recurrence: 0.2x0.5x2.1 N/A N/A Measurements L x W x D (cm) 0.079 N/A N/A A (cm) : rea 0.165 N/A N/A Volume (cm) : -154.80% N/A N/A % Reduction in A rea: -50.00% N/A N/A % Reduction in Volume: 12 Starting Position 1 (o'clock): 12 Ending Position 1 (o'clock): 3.9 Maximum Distance 1 (cm): Yes N/A N/A Undermining: Category/Stage IV N/A N/A Classification: Large N/A N/A Exudate A mount: Serosanguineous N/A N/A Exudate Type: red, brown N/A N/A Exudate Color: Large (67-100%) N/A N/A Granulation A mount: Red N/A N/A Granulation Quality: None Present (0%) N/A N/A Necrotic A mount: Fat Layer (Subcutaneous Tissue): Yes N/A N/A Exposed Structures: Fascia: No Tendon: No Muscle: No Joint: No Bone: No None N/A N/A Epithelialization: Treatment Notes Electronic Signature(sJANNINE, ARIA C ([id number])  [id number] Page 3 of 5 Unsigned Entered By: Angelina Pih on 01/14/2023 14:38:08 -------------------------------------------------------------------------------- Pain Assessment Details Patient Name: Date of Service: Tina Foley, Tina Foley 01/14/2023 2:00 PM Medical Record Number: [id number] Patient Account Number: [id number] Date of Birth/Sex: Treating RN: Jun 09, 1945 (77 y.o. Esmeralda Links Primary Care Caroljean Monsivais: Cleon Dew Other Clinician: Referring Kendry Pfarr: Treating Cass Vandermeulen/Extender: Corliss Skains in Treatment: 2 Active Problems Location of Pain Severity and Description of Pain Patient Has Paino Yes Site Locations Rate the pain. Current Pain Level: 7 Pain Management and Medication Current Pain Management: Electronic Signature(s) Unsigned Entered By: Roger Shelter,  Caitlin on 01/14/2023 14:16:44 Signature(s): LORICE, GUADAGNOLI ([id number]) Date(s): [id number] Page 4 of 5 -------------------------------------------------------------------------------- Wound Assessment Details Patient Name: Date of Service: Tina Foley, Tina Foley 01/14/2023 2:00 PM Medical Record Number: [id number] Patient Account Number: [id number] Date of Birth/Sex: Treating RN: 06-24-1945 (77 y.o. Esmeralda Links Primary Care Gaylan Fauver: Cleon Dew Other Clinician: Referring Slater Mcmanaman: Treating Roberta Angell/Extender: Corliss Skains in Treatment: 2 Wound Status Wound Number: 1 Primary Pressure Ulcer Etiology: Wound Location: Midline Gluteus Wound Open Wounding Event: Gradually Appeared Status: Date Acquired: 12/30/2021 Comorbid Asthma, Congestive Heart Failure, Hypertension, Type II Diabetes, Weeks Of Treatment: 2 History: Neuropathy, Received Chemotherapy, Received Radiation Clustered Wound: No Photos Wound Measurements Length: (cm) 0.2 Width: (cm) 0.5 Depth: (cm) 2.1 Area: (cm) 0.079 Volume:  (cm) 0.165 % Reduction in Area: -154.8% % Reduction in Volume: -50% Epithelialization: None Tunneling: No Undermining: Yes Starting Position (o'clock): 12 Ending Position (o'clock): 12 Maximum Distance: (cm) 3.9 Wound Description Classification: Category/Stage IV Exudate Amount: Large Exudate Type: Serosanguineous Exudate Color: red, brown Foul Odor After Cleansing: No Slough/Fibrino No Wound Bed Granulation Amount: Large (67-100%) Exposed Structure Granulation Quality: Red Fascia Exposed: No Necrotic Amount: None Present (0%) Fat Layer (Subcutaneous Tissue) Exposed: Yes Tendon Exposed: No Muscle Exposed: No Joint Exposed: No Bone Exposed: No Electronic Signature(s) Unsigned Entered ByAngelina Pih on 01/14/2023 14:24:42 Signature(s): KURTISHA, CHUBBUCK ([id number]) [id number] Date(s): [id number] Page 5 of 5 -------------------------------------------------------------------------------- Vitals Details Patient Name: Date of Service: Tina Foley, Tina Foley 01/14/2023 2:00 PM Medical Record Number: [id number] Patient Account Number: [id number] Date of Birth/Sex: Treating RN: 1945/03/05 (77 y.o. Esmeralda Links Primary Care Finian Helvey: Cleon Dew Other Clinician: Referring Renardo Cheatum: Treating Peniel Hass/Extender: Corliss Skains in Treatment: 2 Vital Signs Time Taken: 14:15 Temperature (F): 97.4 Pulse (bpm): 88 Respiratory Rate (breaths/min): 18 Blood Pressure (mmHg): 125/78 Reference Range: 80 - 120 mg / dl Electronic Signature(s) Unsigned Entered By: Angelina Pih on 01/14/2023 14:16:34 Signature(s): Date(s):

## 2023-02-04 ENCOUNTER — Ambulatory Visit: Payer: Medicare Other | Admitting: Physician Assistant

## 2023-02-06 ENCOUNTER — Ambulatory Visit: Payer: Medicare HMO | Admitting: Physician Assistant
